# Patient Record
Sex: Male | Born: 1950
Health system: Southern US, Community
[De-identification: ages and names within clinical notes are randomized; demographics above are authoritative.]

## PROBLEM LIST (undated history)

## (undated) DIAGNOSIS — E785 Hyperlipidemia, unspecified: Secondary | ICD-10-CM

## (undated) DIAGNOSIS — K219 Gastro-esophageal reflux disease without esophagitis: Secondary | ICD-10-CM

## (undated) DIAGNOSIS — F419 Anxiety disorder, unspecified: Secondary | ICD-10-CM

## (undated) DIAGNOSIS — I10 Essential (primary) hypertension: Secondary | ICD-10-CM

## (undated) HISTORY — DX: Hyperlipidemia, unspecified: E78.5

## (undated) HISTORY — PX: PROSTATE SURGERY: SHX751

---

## 2012-11-19 ENCOUNTER — Emergency Department: Payer: Self-pay | Admitting: Unknown Physician Specialty

## 2012-11-19 LAB — BASIC METABOLIC PANEL
BUN: 14 mg/dL (ref 7–18)
Calcium, Total: 8.9 mg/dL (ref 8.5–10.1)
Chloride: 110 mmol/L — ABNORMAL HIGH (ref 98–107)
Co2: 26 mmol/L (ref 21–32)
Creatinine: 1.01 mg/dL (ref 0.60–1.30)
EGFR (Non-African Amer.): >60
Glucose: 117 mg/dL — ABNORMAL HIGH (ref 65–99)

## 2012-11-19 LAB — URINALYSIS, COMPLETE
Bilirubin,UR: NEGATIVE
Glucose,UR: NEGATIVE mg/dL (ref 0–75)
Nitrite: NEGATIVE
Ph: 5 (ref 4.5–8.0)
Protein: 100
Specific Gravity: 1.017 (ref 1.003–1.030)
Squamous Epithelial: NONE SEEN
WBC UR: 5 /HPF (ref 0–5)

## 2012-11-19 LAB — CBC
HCT: 49.4 % (ref 40.0–52.0)
HGB: 16.4 g/dL (ref 13.0–18.0)
MCH: 30.8 pg (ref 26.0–34.0)
MCHC: 33.1 g/dL (ref 32.0–36.0)
MCV: 93 fL (ref 80–100)
Platelet: 225 10*3/uL (ref 150–440)
RBC: 5.31 10*6/uL (ref 4.40–5.90)
RDW: 14.3 % (ref 11.5–14.5)
WBC: 6.8 10*3/uL (ref 3.8–10.6)

## 2012-12-12 ENCOUNTER — Ambulatory Visit: Payer: Self-pay | Admitting: Urology

## 2012-12-12 ENCOUNTER — Inpatient Hospital Stay: Payer: Self-pay | Admitting: Student

## 2012-12-12 LAB — CBC
HCT: 43.6 % (ref 40.0–52.0)
MCH: 29.6 pg (ref 26.0–34.0)
MCV: 94 fL (ref 80–100)
RBC: 4.66 10*6/uL (ref 4.40–5.90)
WBC: 8.1 10*3/uL (ref 3.8–10.6)

## 2012-12-12 LAB — URINALYSIS, COMPLETE
RBC,UR: 166688 /HPF (ref 0–5)
Specific Gravity: 1.026 (ref 1.003–1.030)
Squamous Epithelial: NONE SEEN
WBC UR: 107 /HPF (ref 0–5)

## 2012-12-12 LAB — COMPREHENSIVE METABOLIC PANEL
Bilirubin,Total: 0.7 mg/dL (ref 0.2–1.0)
Calcium, Total: 9 mg/dL (ref 8.5–10.1)
Chloride: 103 mmol/L (ref 98–107)
Creatinine: 1.34 mg/dL — ABNORMAL HIGH (ref 0.60–1.30)
EGFR (Non-African Amer.): 58 — ABNORMAL LOW
Osmolality: 272 (ref 275–301)
Potassium: 3.2 mmol/L — ABNORMAL LOW (ref 3.5–5.1)
SGOT(AST): 25 U/L (ref 15–37)
SGPT (ALT): 29 U/L (ref 12–78)
Sodium: 134 mmol/L — ABNORMAL LOW (ref 136–145)
Total Protein: 6.6 g/dL (ref 6.4–8.2)

## 2012-12-12 LAB — LIPASE, BLOOD: Lipase: 93 U/L (ref 73–393)

## 2012-12-13 LAB — CBC WITH DIFFERENTIAL/PLATELET
Basophil %: 0.4 %
Eosinophil %: 0.8 %
HCT: 32.8 % — ABNORMAL LOW (ref 40.0–52.0)
HGB: 10.9 g/dL — ABNORMAL LOW (ref 13.0–18.0)
Lymphocyte %: 15 %
MCH: 30.8 pg (ref 26.0–34.0)
MCHC: 33.2 g/dL (ref 32.0–36.0)
MCV: 93 fL (ref 80–100)
Monocyte #: 1.1 x10 3/mm — ABNORMAL HIGH (ref 0.2–1.0)
Monocyte %: 8.1 %
Neutrophil %: 75.7 %
Platelet: 169 10*3/uL (ref 150–440)
RDW: 13.6 % (ref 11.5–14.5)

## 2012-12-13 LAB — BASIC METABOLIC PANEL
Chloride: 110 mmol/L — ABNORMAL HIGH (ref 98–107)
Co2: 25 mmol/L (ref 21–32)
Creatinine: 1.24 mg/dL (ref 0.60–1.30)
EGFR (African American): >60
Osmolality: 279 (ref 275–301)
Sodium: 140 mmol/L (ref 136–145)

## 2012-12-13 LAB — URINE CULTURE

## 2012-12-14 LAB — CBC WITH DIFFERENTIAL/PLATELET
Basophil #: 0 10*3/uL (ref 0.0–0.1)
Basophil %: 0.2 %
Eosinophil #: 0.1 10*3/uL (ref 0.0–0.7)
Eosinophil %: 1.2 %
HGB: 9.3 g/dL — ABNORMAL LOW (ref 13.0–18.0)
Lymphocyte #: 1.4 10*3/uL (ref 1.0–3.6)
Lymphocyte %: 15.5 %
MCHC: 34.3 g/dL (ref 32.0–36.0)
MCV: 93 fL (ref 80–100)
Monocyte #: 0.8 x10 3/mm (ref 0.2–1.0)
Monocyte %: 8.4 %
Neutrophil #: 6.7 10*3/uL — ABNORMAL HIGH (ref 1.4–6.5)
Neutrophil %: 74.7 %
Platelet: 152 10*3/uL (ref 150–440)
RDW: 13.4 % (ref 11.5–14.5)

## 2012-12-15 LAB — BASIC METABOLIC PANEL
Creatinine: 1.18 mg/dL (ref 0.60–1.30)
EGFR (Non-African Amer.): >60
Glucose: 101 mg/dL — ABNORMAL HIGH (ref 65–99)
Osmolality: 277 (ref 275–301)
Potassium: 4.1 mmol/L (ref 3.5–5.1)
Sodium: 138 mmol/L (ref 136–145)

## 2012-12-15 LAB — CBC WITH DIFFERENTIAL/PLATELET
Basophil #: 0 10*3/uL (ref 0.0–0.1)
Basophil %: 0.1 %
Lymphocyte %: 10.2 %
MCH: 31.2 pg (ref 26.0–34.0)
MCV: 93 fL (ref 80–100)
Monocyte #: 0.9 x10 3/mm (ref 0.2–1.0)
Platelet: 168 10*3/uL (ref 150–440)
RBC: 2.64 10*6/uL — ABNORMAL LOW (ref 4.40–5.90)
WBC: 10.7 10*3/uL — ABNORMAL HIGH (ref 3.8–10.6)

## 2012-12-16 LAB — CBC WITH DIFFERENTIAL/PLATELET
Eosinophil #: 0.1 10*3/uL (ref 0.0–0.7)
Eosinophil %: 0.9 %
HCT: 24.6 % — ABNORMAL LOW (ref 40.0–52.0)
HGB: 8.4 g/dL — ABNORMAL LOW (ref 13.0–18.0)
Lymphocyte #: 1.9 10*3/uL (ref 1.0–3.6)
MCH: 31.6 pg (ref 26.0–34.0)
Monocyte %: 8.5 %
Neutrophil %: 73 %
Platelet: 214 10*3/uL (ref 150–440)
RBC: 2.67 10*6/uL — ABNORMAL LOW (ref 4.40–5.90)
WBC: 11.1 10*3/uL — ABNORMAL HIGH (ref 3.8–10.6)

## 2012-12-26 LAB — CBC
HGB: 9 g/dL — ABNORMAL LOW (ref 13.0–18.0)
MCH: 31.1 pg (ref 26.0–34.0)
MCHC: 33.8 g/dL (ref 32.0–36.0)
MCV: 92 fL (ref 80–100)
Platelet: 511 10*3/uL — ABNORMAL HIGH (ref 150–440)
RBC: 2.91 10*6/uL — ABNORMAL LOW (ref 4.40–5.90)
WBC: 11.4 10*3/uL — ABNORMAL HIGH (ref 3.8–10.6)

## 2012-12-26 LAB — URINALYSIS, COMPLETE
RBC,UR: 2355 /HPF (ref 0–5)
Specific Gravity: 1.01 (ref 1.003–1.030)
Squamous Epithelial: NONE SEEN
WBC UR: 86 /HPF (ref 0–5)

## 2012-12-26 LAB — BASIC METABOLIC PANEL
Anion Gap: 8 (ref 7–16)
Calcium, Total: 8.6 mg/dL (ref 8.5–10.1)
Chloride: 102 mmol/L (ref 98–107)
EGFR (African American): >60
Osmolality: 269 (ref 275–301)
Potassium: 3.3 mmol/L — ABNORMAL LOW (ref 3.5–5.1)
Sodium: 134 mmol/L — ABNORMAL LOW (ref 136–145)

## 2012-12-27 ENCOUNTER — Ambulatory Visit: Payer: Self-pay | Admitting: Urology

## 2012-12-27 LAB — CBC
HGB: 9.6 g/dL — ABNORMAL LOW (ref 13.0–18.0)
MCH: 31 pg (ref 26.0–34.0)
Platelet: 478 10*3/uL — ABNORMAL HIGH (ref 150–440)
RBC: 3.08 10*6/uL — ABNORMAL LOW (ref 4.40–5.90)
RDW: 14.6 % — ABNORMAL HIGH (ref 11.5–14.5)

## 2012-12-27 LAB — PROTIME-INR: INR: 1.2

## 2012-12-27 LAB — APTT: Activated PTT: 29.4 secs (ref 23.6–35.9)

## 2012-12-28 ENCOUNTER — Inpatient Hospital Stay: Payer: Self-pay | Admitting: Internal Medicine

## 2012-12-28 LAB — CBC WITH DIFFERENTIAL/PLATELET
Basophil %: 0.7 %
Eosinophil #: 0.2 10*3/uL (ref 0.0–0.7)
HCT: 22.7 % — ABNORMAL LOW (ref 40.0–52.0)
MCH: 31.1 pg (ref 26.0–34.0)
Neutrophil #: 6.9 10*3/uL — ABNORMAL HIGH (ref 1.4–6.5)
Neutrophil %: 71 %
Platelet: 383 10*3/uL (ref 150–440)
RBC: 2.45 10*6/uL — ABNORMAL LOW (ref 4.40–5.90)
WBC: 9.7 10*3/uL (ref 3.8–10.6)

## 2012-12-28 LAB — BASIC METABOLIC PANEL
BUN: 12 mg/dL (ref 7–18)
Calcium, Total: 8.5 mg/dL (ref 8.5–10.1)
Chloride: 111 mmol/L — ABNORMAL HIGH (ref 98–107)
Co2: 28 mmol/L (ref 21–32)
Creatinine: 1.15 mg/dL (ref 0.60–1.30)
EGFR (Non-African Amer.): >60
Glucose: 92 mg/dL (ref 65–99)
Osmolality: 283 (ref 275–301)
Potassium: 3.9 mmol/L (ref 3.5–5.1)

## 2012-12-28 LAB — CLOSTRIDIUM DIFFICILE BY PCR

## 2012-12-28 LAB — URINE CULTURE

## 2012-12-29 LAB — CBC WITH DIFFERENTIAL/PLATELET
Basophil #: 0 10*3/uL (ref 0.0–0.1)
Basophil %: 0.4 %
Eosinophil %: 2.5 %
Lymphocyte #: 2.1 10*3/uL (ref 1.0–3.6)
Lymphocyte %: 26.5 %
MCH: 29.9 pg (ref 26.0–34.0)
MCV: 88 fL (ref 80–100)
Monocyte %: 6.7 %
Neutrophil #: 5.1 10*3/uL (ref 1.4–6.5)
RDW: 19 % — ABNORMAL HIGH (ref 11.5–14.5)
WBC: 7.9 10*3/uL (ref 3.8–10.6)

## 2012-12-29 LAB — BASIC METABOLIC PANEL
Calcium, Total: 8.2 mg/dL — ABNORMAL LOW (ref 8.5–10.1)
Chloride: 112 mmol/L — ABNORMAL HIGH (ref 98–107)
Co2: 27 mmol/L (ref 21–32)
EGFR (African American): >60
EGFR (Non-African Amer.): >60
Osmolality: 286 (ref 275–301)
Potassium: 3.5 mmol/L (ref 3.5–5.1)
Sodium: 144 mmol/L (ref 136–145)

## 2012-12-29 LAB — HEMOGLOBIN: HGB: 9.4 g/dL — ABNORMAL LOW (ref 13.0–18.0)

## 2012-12-30 LAB — CBC WITH DIFFERENTIAL/PLATELET
Basophil #: 0.1 10*3/uL (ref 0.0–0.1)
Eosinophil #: 0.2 10*3/uL (ref 0.0–0.7)
Eosinophil %: 2.8 %
HCT: 26.5 % — ABNORMAL LOW (ref 40.0–52.0)
HGB: 9.1 g/dL — ABNORMAL LOW (ref 13.0–18.0)
Lymphocyte %: 24 %
MCHC: 34.2 g/dL (ref 32.0–36.0)
MCV: 89 fL (ref 80–100)
Monocyte #: 0.6 x10 3/mm (ref 0.2–1.0)
Monocyte %: 7.7 %
Neutrophil #: 5.3 10*3/uL (ref 1.4–6.5)
Neutrophil %: 64.8 %
RDW: 17.8 % — ABNORMAL HIGH (ref 11.5–14.5)
WBC: 8.2 10*3/uL (ref 3.8–10.6)

## 2013-02-17 ENCOUNTER — Ambulatory Visit: Payer: Self-pay | Admitting: Urology

## 2013-02-17 DIAGNOSIS — F17200 Nicotine dependence, unspecified, uncomplicated: Secondary | ICD-10-CM

## 2013-02-22 ENCOUNTER — Ambulatory Visit: Payer: Self-pay | Admitting: Urology

## 2014-09-16 NOTE — Consult Note (Signed)
Brief Consult Note: Diagnosis: Hematuria, Clot retention.   Patient was seen by consultant.   Consult note dictated.   Recommend further assessment or treatment.   Orders entered.   Discussed with Attending MD.   Comments: cont. CBI, wean to clear, Home with foley once clear offf CBI,  outpt. f/u for Cystoscopy with Dr. Lonna CobbStoioff  dict. # [id number].  Electronic Signatures: Charyl BiggerHouser, Ellwyn Ergle Ross (MD)  (Signed 19-Jul-14 11:12)  Authored: Brief Consult Note   Last Updated: 19-Jul-14 11:12 by Charyl BiggerHouser, Aayansh Codispoti Ross (MD)

## 2014-09-16 NOTE — Consult Note (Signed)
PATIENT NAME:  Richard Lamb, Eero D MR#:  [id number] DATE OF BIRTH:  Mar 12, 1951  DATE OF CONSULTATION:  12/12/2012  REFERRING PHYSICIAN:  Dorothea GlassmanPaul Malinda, MD CONSULTING PHYSICIAN:  Tawni MillersEdward R. Richardson LandryHouser, II, MD  REASON FOR CONSULTATION: Clot retention and gross hematuria.   HISTORY OF PRESENT ILLNESS: The patient is a 64 year old with no primary urologist, who presents to the ER with hematuria and blood oozing from the penis. He has had this happen once before greater than 6 months ago, which resolved on its own. He reports the last several days an increase in his baseline urinary symptoms of urgency and frequency and decreased force of stream, and now he is unable to urinate and does so with difficulty. Once in the ER, he was found to have a significant amount of blood in his bladder. His hemoglobin dropped from 13 to 6 seen on previous. A Foley catheter was inserted and was unable to be successfully irrigated due to the amount of clot retention. A CT scan was also ordered and showed a large blood clot within the bladder as well as bilateral hydronephrosis and an enlarged prostate with some pericystic inflammatory changes. The patient was subsequently admitted to medicine. He does have some acute decline in his renal function at 1.36 creatinine over a baseline of 1 and some mild metabolic derangements. The patient denies any fever, chills, sweats, nausea or vomiting.   PAST MEDICAL HISTORY: Hematuria.   PAST SURGICAL HISTORY: None.   SOCIAL HISTORY: A pack-per-day smoker, limited social alcohol use.   FAMILY HISTORY: No prostate cancer or bladder cancer.   ALLERGIES: SULFA.   HOME MEDICATIONS: None.   REVIEW OF SYSTEMS: As outlined above in the HPI.  CONSTITUTIONAL: No fever, chills or sweats.  HEAD, EYES, EARS, NOSE AND THROAT: No loss of vision or change. RESPIRATORY: No change in respiratory status. No shortness of breath.  CARDIOVASCULAR: No chest pain.  GASTROINTESTINAL: No nausea or vomiting.   GENITOURINARY: Blood in the urine, increased frequency and dysuria.  ENDOCRINE: No heat or cold intolerance.  HEMATOLOGY: Denies any bruising.  SKIN: Denies any rash or lesion.  MUSCULOSKELETAL: Does note he has some cramping.  NEUROLOGICAL: Denies ataxia or dementia.  PSYCHIATRIC: Denies depression. Is somewhat anxious.   PHYSICAL EXAMINATION:  VITAL SIGNS: The patient's current vitals are as follows: 98.3, pulse 100, respiratory rate 18, blood pressure 179/97, satting 97% on room air.  GENERAL: Well-nourished normal male in no apparent distress. Alert and oriented x3, conversational.  HEAD, EYES, EARS, NOSE AND THROAT: Normocephalic, atraumatic, nonicteric.  NECK: Supple without any lymphadenopathy.  CHEST: Clear and nonlabored.  CARDIOVASCULAR: 2+ upper and lower extremity pulses x4.  ABDOMEN: Soft. He has suprapubic tenderness and distention and palpable bladder. No rebound or guarding.  GENITOURINARY: Normal uncircumcised penis and bilaterally descended testes. He has dried blood from his meatus. His Foley catheter is clotted off.  RECTAL: Deferred by patient.  EXTREMITIES: No cyanosis, clubbing or edema.  PSYCHIATRIC: Appropriate. NEUROLOGIC: Grossly intact.  MUSCULOSKELETAL: Has 5 out of 5 strength.  SKIN: No rash or lesion.   LABORATORY VALUES: Creatinine 1.3.   IMAGING: CT scan images and report reviewed, shows blood clot within the bladder, bilateral hydronephrosis, inflammatory changes around the bladder and an enlarged prostate.   ASSESSMENT: A 64 year old with clot retention and hematuria, significant clot in the bladder, unable to be successfully irrigated. At this point in time, a 8[address] [place] catheter was placed atraumatically within the penis, and a liter of clot was removed.  At that point in time, once I got him free from clot after using a liter of irrigation, I then switched him over to a 24 [place] 3-way Foley catheter and started him on moderate rate continuous  bladder irrigation with very light pink urine output, and again, no other clot could be irrigated through. This has a 30 mL balloon with sterile water.   PLAN: 1. Continue CBI, wean to clear.  2. Monitor H and H.  3. Antibiotics as appropriate per primary team.  4. Once clear, recommend to continue Foley catheter through discharge. May downsize it if desires and should go home with Foley catheter and follow up with local urologist outpatient for cystoscopy.   ____________________________ Tawni Millers. Weldon Inches, MD erh:OSi D: 12/12/2012 11:08:07 ET T: 12/12/2012 11:39:24 ET JOB#: [id number]  cc: Tawni Millers. Weldon Inches, MD, <Dictator> Beaulah Corin MD ELECTRONICALLY SIGNED 12/13/2012 9:42

## 2014-09-16 NOTE — Consult Note (Signed)
Urology Consultation Report for Consultation: Recurrent Gross Hematuria MD: Enid Baasadhika Kalisetti, M.D.MD: Marin OlpJay H. Helaina Stefano, M.D.Local Urologist: Irineo AxonScott Stoioff, M.D. 64 y.o. AAM [facility] (7/19-22/2014). At that hospitalization, the pt was taken to the OR for diagnostic cystourethroscopy by Dr. Lonna CobbStoioff on 12/14/2012. Trilobar prostatic enlargement with bladder neck hypervascularity was appreciated with fulguration of obvious bleeders performed. The pt was d/c'd on finasteride, tamsulosin and ferrous sulfate. The pt underwent a voiding trial 12/18/2012 with the foley removed in Dr. Heywood FootmanStoioff's office. The pt did well initially, voiding well with only mild dysuria, but no freq/urgency or F/C.  The pt developed recurrent gross hematuria 2 days ago that progressed to clot retention prompting [facility]. A foley catheter was placed and the bladder irrigated. The pt is admitted for CBI. as per the detailed H&P by Dr. Burnice Loganarwich dated 12/27/2012 with the following additions: PT endorses a FH of Urolithiasis T (98.105F), P (64), RR (16), BP (105/64), RA Sat (98%)WDWN AAM in MADNC/AT, EOMI, Anictericno masses or bruitsCTA, nL Respiratory EffortRRR without M/G/R, 2+ Radial Pulses b/LNT/ND, no palpable masses/organomegalynL uncirc phallus with foley catheter in place draining lt pink to clear urine on moderate CBI; b/L descended testes/epididymides - NT, no massesno edemanon-focalA&O x4, appropriateno lesions about the Head/Neck 12/27/2012: WBC (15k), Hct (28.7%), Plt (478k)         12/26/2012: BUN (13), Cr (0.88) No current indication for acute urologic intervention. Recurrent Gross Hematuria - likely due to prostate mucosal/bladder neck varices, however, the upper tracts have not been evaluated.Prostate Hyperplasia - PSA was elevated at 6.3 on 12/12/2012 (?due to prostate manipulation with foley placement/bladder irrigation vs possible malignancy)  Continue NS CBI to keep the urine  light pink to clear.Continue finasteride and tamsulosin.Check PT/PTT to r/o occult coagulopathy.CT Urogram (3 Phase CT of the Abd/Pelvis without and with IV Contrast) to r/o upper tract lesion Stoioff will assume care at 5am, December 28, 2012.  Electronic Signatures: Marin OlpKim, Jaqua Ching H (MD)  (Signed on 03-Aug-14 12:33)  Authored  Last Updated: 03-Aug-14 12:33 by Marin OlpKim, Kwali Wrinkle H (MD)

## 2014-09-16 NOTE — H&P (Signed)
PATIENT NAME:  Richard Lamb, Richard Lamb MR#:  [id number] DATE OF BIRTH:  Dec 30, 1950  DATE OF ADMISSION:  02/22/2013  CHIEF COMPLAINT: Difficulty voiding and hematuria.   HISTORY OF PRESENT ILLNESS: Richard Lamb is a 64 year old African American male who presented with clot retention and significant prostatic bleeding requiring blood transfusion in early August. CT scan indicated normal kidneys but large prostate. Further investigation in the office indicated trilobar BPH and bladder outlet obstruction. He also had a bladder stone present. The patient comes in now for photovaporization of the prostate with green light laser and litholapaxy of the bladder stone with the holmium laser.   ALLERGIES: SULFA DRUGS.   CURRENT MEDICATIONS: Tamsulosin, finasteride and iron sulfate.   PAST SURGICAL HISTORY: Ankle surgery.   SOCIAL HISTORY: The patient denied alcohol use. He smokes a pack a day and has a 30 pack-year history.   FAMILY HISTORY: Remarkable for BPH and hypertension.   PAST AND CURRENT MEDICAL CONDITIONS: Negative.   REVIEW OF SYSTEMS: The patient denied heart disease, lung disease, diabetes, stroke, hypertension, shortness of breath or chest pain.   PHYSICAL EXAMINATION: GENERAL: A well-nourished African American male in no distress.  HEENT: Sclerae were clear. Pupils were equally round and reactive to light and accommodation.  NECK: Supple. No palpable cervical adenopathy.  LUNGS: Clear to auscultation.  HEART: Regular rhythm and rate without audible murmurs.  ABDOMEN: Soft, nontender abdomen.  GENITOURINARY: Uncircumcised. Testes smooth and nontender, 20 mL in size each.  RECTAL: Internal hemorrhoids. Prostate gland 40 grams, smooth and nontender.  NEUROMUSCULAR: Alert and oriented x 3.   IMPRESSION:  1.  Benign prostatic hypertrophy with bladder outlet obstruction.  2.  Gross hematuria due to benign prostatic hypertrophy and bladder stone.  3.  Bladder stone.   PLAN: Photovaporization  of the prostate with green light laser and litholapaxy of the bladder stone with the holmium laser.  ____________________________ Suszanne ConnersMichael R. Evelene CroonWolff, MD mrw:sb Lamb: 02/18/2013 08:28:58 ET T: 02/18/2013 08:41:18 ET JOB#: [id number]  cc: Suszanne ConnersMichael R. Evelene CroonWolff, MD, <Dictator> Orson ApeMICHAEL R Garnell Phenix MD ELECTRONICALLY SIGNED 02/19/2013 8:41

## 2014-09-16 NOTE — H&P (Signed)
PATIENT NAME:  Richard Lamb, KESTLER MR#:  [id number] DATE OF BIRTH:  1950/10/13  DATE OF ADMISSION:  12/27/2012  DATE OF BIRTH: February 18, 1951   PRIMARY CARE PHYSICIAN: He has no doctor.   REFERRING PHYSICIAN: Dr. Chiquita Loth.   CHIEF COMPLAINT: Suprapubic discomfort associated with gross hematuria and passing blood clots.   HISTORY OF PRESENT ILLNESS: Richard Lamb is a 64 year old African-American male admitted to this hospital on 12/12/2012 and discharged on 12/15/2012, when he presented with gross hematuria associated with passing blood clots, obstructive uropathy resulting in hydronephrosis and acute renal failure. He had bladder irrigation and then consultation with Urology, when the patient was evaluated by Dr. Lonna Cobb and he had cystoscopy with evacuation of blood clots. With improvement in his symptoms, he was discharged home to have followup the second day, an appointment with Dr. Lonna Cobb. However, the patient tells me that when he went home nobody gave him a follow up appointment and he appears to have no clue about this issue. He remained asymptomatic until yesterday, when symptoms recurred again, started passing blood with clots and developed suprapubic discomfort and pain and difficulty to urinate. He came back today with the same symptoms and bladder irrigation started here with CBI. Following this, the patient started to feel better and the discomfort had improved. His hemoglobin at time of admission is 9. His hemoglobin at time of discharge on July 22nd was 8.   REVIEW OF SYSTEMS: CONSTITUTIONAL: Denies having any fever. No chills. No fatigue.  EYES: No blurring of vision or double vision.  ENT: No hearing impairment. No sore throat. No dysphagia.  CARDIOVASCULAR: No chest pain. No shortness of breath. No syncope.  RESPIRATORY: No cough. No shortness of breath. No chest pain.  GASTROINTESTINAL: No abdominal pain, other than the suprapubic discomfort. No vomiting, no diarrhea.  GENITOURINARY:  He is having gross hematuria and passing blood clots with some difficulty to urinate.  MUSCULOSKELETAL: No joint pain or swelling. No muscular pain or swelling.  INTEGUMENTARY: No skin rash. No ulcers.  NEUROLOGY: No focal weakness. No seizure activity. No headache.  PSYCHIATRY: No anxiety. No depression.  ENDOCRINE: No polyuria or polydipsia. No heat or cold intolerance.   PAST MEDICAL HISTORY: Recent admission on July 19th this month with gross hematuria, underwent cystoscopy with evacuation of blood clots. He was found to have enlarged prostate with some prostatic mass. His recent PSA on July 19th was 6.3. Also, during this admission earlier this month, he had acute renal failure associated with mild bilateral hydronephrosis secondary to obstructive uropathy from clot formation; however, the renal failure had resolved. His history also includes tobacco abuse.   PAST SURGICAL HISTORY: None.   FAMILY HISTORY: He reports that he has one uncle who has prostatic hypertrophy. His father suffers from hypertension. His mother is healthy.   SOCIAL HABITS: Chronic smoker, 1/2 pack a day and continues to smoke.  He started smoking at age of 31. He drinks alcohol only occasionally. No other drug abuse.    SOCIAL HISTORY: The patient works at a [employer].  ADMISSION MEDICATIONS:  Tamsulosin 0.4 mg once a day at the right 5 mg a day, finasteride 5 mg daily, ferrous sulfate 325 mg twice a day.   ALLERGIES:  SULFA CAUSING SOME GI SIDE EFFECTS.    PHYSICAL EXAMINATION: VITAL SIGNS: Blood pressure 112/55, respiratory rate 20, pulse 88, temperature 96.2, oxygen saturation 99%.  GENERAL APPEARANCE: This is a middle-aged male lying in bed, in no acute distress.  HEAD AND  NECK: No pallor. No icterus. No cyanosis. Ear examination revealed normal hearing, no discharge, no lesions. Examination of the nose showed no discharge, no bleeding, no ulcers. Oropharyngeal examination revealed normal lips and tongue. No  oral thrush, no exudate. Eye examination revealed normal eyelids and conjunctivae. Both pupils were small and constricted. I could not see reactivity to light. Neck is supple. Trachea at midline. No thyromegaly. No cervical lymphadenopathy. No masses.  HEART: Normal S1, S2. No S3, S4. No murmur. No gallop. No carotid bruits. LUNGS: Normal breathing pattern without use of accessory muscles. No rales.  No wheezing.  ABDOMEN: Soft without tenderness. No hepatosplenomegaly. No masses. No hernias.  SKIN: No ulcers. No subcutaneous nodules.  MUSCULOSKELETAL: No joint swelling. No clubbing.  NEUROLOGIC: Cranial nerves II through XII are intact. No focal motor deficit.  PSYCHIATRIC: The patient is alert and oriented x3. Mood and affect were normal.   LABORATORY FINDINGS: His CBC showed white count of 11,000. The white blood cell count at discharge was also 11. His hemoglobin is 9. His hemoglobin at discharge on July 23rd  was 8.4. His hematocrit now is 26 with a platelet count of 511. His recent PSA on July 19th was 6.3. Serum glucose 109, BUN 13, creatinine 0.8, sodium 134, potassium 3.3. Calcium 8.6. Urinalysis showed red-colored urine, 2355 red blood cells, 86 white blood cells.   ASSESSMENT: 1.  Gross hematuria.  2. Prostatic enlargement, thought to be benign prostatic hypertrophy; however, there is also prostatic mass and it is under evaluation.  3.  Mild hyponatremia.  4.  Mild hypokalemia.  5.  Tobacco abuse.   PLAN: We will admit the patient to the hospital for observation. Continue bladder irrigation with CBI. Urologic consultation. Follow up on the hemoglobin. Potassium supplementation. For deep vein thrombosis prophylaxis, will use TED stockings and compression. We will avoid chemical anticoagulation given his gross hematuria. Time spent in evaluating this patient and reviewing medical records took more than 55 minutes. I advised the patient to quit  smoking.   ____________________________ Carney CornersAmir M. Rudene Rearwish, MD amd:nts D: 12/27/2012 05:02:00 ET T: 12/27/2012 05:25:17 ET JOB#: [id number]  cc: Carney CornersAmir M. Rudene Rearwish, MD, <Dictator> Zollie ScaleAMIR M Jyasia Markoff MD ELECTRONICALLY SIGNED 12/29/2012 6:59

## 2014-09-16 NOTE — Consult Note (Signed)
Chief Complaint:  Subjective/Chief Complaint no complaints this am   VITAL SIGNS/ANCILLARY NOTES: **Vital Signs.:   22-Jul-14 04:06  Vital Signs Type Routine  Temperature Temperature (F) 98.1  Celsius 36.7  Temperature Source oral  Pulse Pulse 76  Respirations Respirations 18  Systolic BP Systolic BP 620  Diastolic BP (mmHg) Diastolic BP (mmHg) 56  Mean BP 72  Pulse Ox % Pulse Ox % 93  Pulse Ox Activity Level  At rest  Oxygen Delivery Room Air/ 21 %   Brief Assessment:  Additional Physical Exam CBI effluent clear on low flow   Lab Results: Routine Chem:  22-Jul-14 04:03   Glucose, Serum  101  BUN 17  Creatinine (comp) 1.18  Sodium, Serum 138  Potassium, Serum 4.1  Chloride, Serum 105  CO2, Serum 27  Calcium (Total), Serum  8.4  Anion Gap  6  Osmolality (calc) 277  eGFR (African American) >60  eGFR (Non-African American) >60 (eGFR values <49m/min/1.73 m2 may be an indication of chronic kidney disease (CKD). Calculated eGFR is useful in patients with stable renal function. The eGFR calculation will not be reliable in acutely ill patients when serum creatinine is changing rapidly. It is not useful in  patients on dialysis. The eGFR calculation may not be applicable to patients at the low and high extremes of body sizes, pregnant women, and vegetarians.)  Routine Hem:  22-Jul-14 04:03   WBC (CBC)  10.7  RBC (CBC)  2.64  Hemoglobin (CBC)  8.2  Hematocrit (CBC)  24.5  Platelet Count (CBC) 168  MCV 93  MCH 31.2  MCHC 33.7  RDW 13.0  Neutrophil % 80.9  Lymphocyte % 10.2  Monocyte % 8.8  Eosinophil % 0.0  Basophil % 0.1  Neutrophil #  8.6  Lymphocyte # 1.1  Monocyte # 0.9  Eosinophil # 0.0  Basophil # 0.0 (Result(s) reported on 15 Dec 2012 at 05:44AM.)   Assessment/Plan:  Assessment/Plan:  Assessment Hematuria secondary to prostatic bleeding.  The hematuria appears to have resolved with Amicar.   Plan 1. Discontinue CBI  2. Start tamsulosin and  finasteride 3.  Can discharge with an indwelling Foley if no recurrent bleeding CBI.  Follow-up 7/24 for catheter removal   Electronic Signatures: SAbbie Sons(MD)  (Signed 22-Jul-14 08:05)  Authored: Chief Complaint, VITAL SIGNS/ANCILLARY NOTES, Brief Assessment, Lab Results, Assessment/Plan   Last Updated: 22-Jul-14 08:05 by SAbbie Sons(MD)

## 2014-09-16 NOTE — Consult Note (Signed)
Chief Complaint:  Subjective/Chief Complaint Required intermittent manual irrigation overnight   VITAL SIGNS/ANCILLARY NOTES: **Vital Signs.:   21-Jul-14 03:58  Vital Signs Type Routine  Temperature Temperature (F) 99.1  Celsius 37.2  Temperature Source oral  Pulse Pulse 100  Respirations Respirations 18  Systolic BP Systolic BP 94  Diastolic BP (mmHg) Diastolic BP (mmHg) 59  Mean BP 70  Pulse Ox % Pulse Ox % 90  Pulse Ox Activity Level  At rest  Oxygen Delivery Room Air/ 21 %   Brief Assessment:  Additional Physical Exam Effluent pink tinged on high flow. Red on low flow.   Assessment/Plan:  Assessment/Plan:  Assessment Hematuria: Etiology is undetermined.  He continues to have hematuria on CBI X 48 hrs which does require intermittent manual irrigation.   Plan Cystoscopy under anesthesia with clot evacuation, possible TURBT and possible bilateral retrograde pyelogram.  The indications and nature of the planned procedure were discussed in detail with the patient including the potential risks of continued bleeding, infection and bladder injury.  He indicated all questions were answered to his satisfaction and desires to proceed.   Electronic Signatures: Riki AltesStoioff, Scott C (MD)  (Signed 21-Jul-14 08:12)  Authored: Chief Complaint, VITAL SIGNS/ANCILLARY NOTES, Brief Assessment, Assessment/Plan   Last Updated: 21-Jul-14 08:12 by Riki AltesStoioff, Scott C (MD)

## 2014-09-16 NOTE — Consult Note (Signed)
Chief Complaint:  Subjective/Chief Complaint Difficulty sleeping   VITAL SIGNS/ANCILLARY NOTES: **Vital Signs.:   05-Aug-14 04:34  Vital Signs Type Routine  Pulse Pulse 72  Respirations Respirations 20  Systolic BP Systolic BP 104  Diastolic BP (mmHg) Diastolic BP (mmHg) 67  Mean BP 79  BP Source  if not from Vital Sign Device non-invasive  Pulse Ox % Pulse Ox % 93  Pulse Ox Activity Level  At rest  Oxygen Delivery Room Air/ 21 %  Pulse Ox Heart Rate 78   Brief Assessment:  Additional Physical Exam CBI clear on high flow- was placed to low flow with pink tinged effluent   Radiology Results: CT:    03-Aug-14 14:29, CT Abdomen and Pelvis W/WO Contrast  CT Abdomen and Pelvis W/WO Contrast   REASON FOR EXAM:    (1) Gross hematuria - r/o renal or ureteral lesion;   (2) Gross Hematuria - r/o ur  COMMENTS:   May transport without cardiac monitor    PROCEDURE: CT  - CT ABDOMEN / PELVIS  W/WO  - Dec 27 2012  2:29PM     RESULT:     Comparison is made to a prior study dated 12/12/2012. The delay in   dictation was secondary to technical issues and image retrieval.    Technique: Helical 3 mm sections were obtained from the lung bases   through the pubic symphysis status post intravenous administration of 100   ml of Isovue-370. Delayed imaging was obtained.  Findings:  The lung bases are grossly unremarkable. The liver, spleen,   adrenals, and pancreas is unremarkable.    Evaluation of the kidneys demonstrates no evidence of hydronephrosis,   masses, or evidence of calculi. The left ureter is mildly dilated. There   is minimal dilation of the right ureter. There is no evidence of an   abdominal aortic aneurysm. The celiac, SMA, IMA, and portal vein are   opacified.    There is no evidence of bowel obstruction, enteritis, colitis,   diverticulitis, or appendicitis. Evaluation of the urinary bladder   demonstrates an amorphous appearing area of intermediate to high   attenuation  within the bladder. Along the peripheral base of the bladder,   rounded, mass-like areas are appreciated with punctate calcifications.   Air is identified within the nondependent portion of the bladder. There     is inflammatory change and a small amount of fluid within the mesenteric   fat adjacent to the urinary bladder as well as mild bladder wall   thickening. No drainable loculated fluid collections are identified or   evidence of free air.     There is no evidence of an abdominal aortic aneurysm. Coarse vascular   calcifications are identified within the aorta and mesenteric vessels.    IMPRESSION:      1. Considering the patient's history, there are findings within the   urinary bladder consistent with hematoma. A bladder wall mass along the   posterior periphery of the bladder is also of diagnostic consideration.   Further evaluation with direct visualization is recommended. There is   inflammatory change surrounding the bladder which may represent     desmoplastic reaction or possibly the sequela of a component of cystitis.   2. No further focal or acute abnormality identified.   3. Vascular calcifications are appreciated.       Thank you for this opportunity to contribute to the care of your patient.         Verified  By: Jani FilesHECTOR W. COOPER, M.D., MD   Assessment/Plan:  Assessment/Plan:  Assessment Hematuria- improved. CT findongs c/w clot as recent cysto showed no tumor   Plan CBI presently looks good.  Would taper CBI this am and if no increased bleeding d/c CBI.  May be able to d/c foley tomorrow   Electronic Signatures: Riki AltesStoioff, Nilan Iddings C (MD)  (Signed 05-Aug-14 07:10)  Authored: Chief Complaint, VITAL SIGNS/ANCILLARY NOTES, Brief Assessment, Radiology Results, Assessment/Plan   Last Updated: 05-Aug-14 07:10 by Riki AltesStoioff, Debrah Granderson C (MD)

## 2014-09-16 NOTE — Discharge Summary (Signed)
PATIENT NAME:  Richard Lamb, Hobson D MR#:  [id number] DATE OF BIRTH:  November 01, 1950  DATE OF ADMISSION:  12/27/2012 DATE OF DISCHARGE:  12/30/2012  ADMITTING PHYSICIAN: Dr. Rudene Rearwish  DISCHARGING PHYSICIAN: Dr. Enid Baasadhika Travez Stancil  PRIMARY CARE PHYSICIAN: None  CONSULTATIONS IN THE HOSPITAL: Dr. Lonna CobbStoioff   DISCHARGE DIAGNOSES: 1.  Gross hematuria.  2.  Benign prostatic hypertrophy.  3.  Prostrate varices causing the hematuria.  4.  Acute on chronic blood loss anemia this admission requiring 2 units of packed red blood cell transfusion.   DISCHARGE MEDICATIONS: 1.  Finasteride 5 mg p.o. daily.  2.  Flomax 0.4 mg p.o. daily.  3.  Ferrous sulfate 325 mg p.o. b.i.d.   DISCHARGE DIET: Regular.   DISCHARGE ACTIVITY: As tolerated.   FOLLOW-UP INSTRUCTIONS: 1.  Follow up with Dr. Lonna CobbStoioff in 2 weeks.  2.  Advised to drink plenty of fluids, preferably water.   LABS AND IMAGING STUDIES PRIOR TO DISCHARGE:  WBC is 8.2, hemoglobin 9.1, hematocrit 26.5, platelet count is 287.  Sodium 144, potassium 3.5, chloride 112, bicarbonate of 27, BUN 11, creatinine 1.21, glucose 90 and calcium of 8.2. Stool for C. diff was negative. Urine culture showing only 3000 gram-positive cocci.  INR was 1.2, PTT 29.4.  CT of the abdomen and pelvis with and without contrast showing findings in the gallbladder consistent with hematoma. Gallbladder mass cannot be ruled out. No focal or acute abnormality otherwise noted. Vascular calcifications are seen.   BRIEF HOSPITAL COURSE:  Richard Lamb is a 64 year old African American male with no significant past medical history other than a recent admission on 12/12/2012 for hematuria, was seen by Dr. Lonna CobbStoioff during that admission, had a cystoscopy done which showed enlarged prostate with friable hypervascular prostatic varices which were probably the cause of his hematuria at the time.  He was treated with Amicar, which is aminocaproic acid to control the bleeding during this admission and  was discharged with a Foley catheter on finasteride and Flomax. The patient was seen by Dr. Lonna CobbStoioff as an outpatient a couple of days after discharge, had Foley catheter taken out.  His bleeding had resolved. He was doing fine up until 12/26/2012 when he started to have hematuria again, so he presented to the hospital on 08/3 and was admitted for the same problem.   Gross hematuria secondary to hypervascular prostate from prostatic varices which were recently seen on cystoscopy done by Dr. Lonna CobbStoioff in the hospital on 12/14/2012:  The patient was admitted for continuous bladder irrigation.  Several clots were cleaned out, and he had a Foley catheter in the hospital. During this hospitalization, his hemoglobin has dropped significantly to 7.6, whereas his baseline is around 13.8, and he did receive 2 units of packed RBC transfusion for symptomatic anemia. His urine has pretty much cleared up now. He again required Amicar during this hospitalization which is being stopped at the time of discharge. The patient is feeling fine, his urine has cleared up.  He still might get some dark clots from his bladder. CT of the abdomen was done which did not show any upper urological causes of gross hematuria like in the kidneys or ureteral masses. He is being discharged on finasteride and Flomax again.  The goal is to shrink the size of the prostate. Hopefully he will not have further bleeding again, but if he does have recurrent bleeding he will need transurethral resection of prostate. The patient is advised to follow up with Dr. Lonna CobbStoioff as an outpatient in the  next couple of weeks. His course has been otherwise uneventful in the hospital.   DISCHARGE CONDITION: Stable.   DISCHARGE DISPOSITION: Home.        TIME SPENT ON DISCHARGE: 45 minutes.   ____________________________ Enid Baas, MD rk:cb D: 12/30/2012 13:44:27 ET T: 12/30/2012 20:43:21 ET JOB#: [id number]  cc: Enid Baas, MD,  <Dictator> Scott C. Lonna Cobb, MD Enid Baas MD ELECTRONICALLY SIGNED 01/08/2013 14:12

## 2014-09-16 NOTE — Consult Note (Signed)
CBI was discontinued yesterday afternoon with no recurrent bleeding.  Urine today is darker consistent with old blood.  Recommendation: 1.  Okay to DC Foley catheter 2.  Okay for discharge if no voiding problems post catheter removal 3.  Follow-up with me in approximately 2 weeks 4.  Continue finasteride and tamsulosin  Electronic Signatures: Riki AltesStoioff, Scott C (MD)  (Signed on 06-Aug-14 08:23)  Authored  Last Updated: 06-Aug-14 08:23 by Riki AltesStoioff, Scott C (MD)

## 2014-09-16 NOTE — Consult Note (Signed)
Cystoscopy today remarkable for a hypervascular, friable prostate. He has no complaints.  CBI effluent is medium red on low-flow. Plan: Will start Amicar.  Electronic Signatures: Riki AltesStoioff, Scott C (MD)  (Signed on 21-Jul-14 19:05)  Authored  Last Updated: 21-Jul-14 19:05 by Riki AltesStoioff, Scott C (MD)

## 2014-09-16 NOTE — Consult Note (Signed)
Chief Complaint:  Subjective/Chief Complaint no events, no complaints   VITAL SIGNS/ANCILLARY NOTES: **Vital Signs.:   20-Jul-14 04:28  Vital Signs Type Routine  Temperature Temperature (F) 98.7  Celsius 37  Temperature Source oral  Pulse Pulse 90  Respirations Respirations 18  Systolic BP Systolic BP 93  Diastolic BP (mmHg) Diastolic BP (mmHg) 68  Mean BP 76  Pulse Ox % Pulse Ox % 93  Pulse Ox Activity Level  At rest  Oxygen Delivery Room Air/ 21 %  *Intake and Output.:   Daily 20-Jul-14 07:00  Grand Totals Intake:  55464 Output:  [id number]    Net:  -9006 24 Hr.:  -9006  IV (Primary)      In:  1464  Urine ml     Out:  1400  Continuous Bladder Irrigation ml     In:  54000    Out:  [id number]    Net:  -9070  Length of Stay Totals Intake:  55464 Output:  [id number]    Net:  -9006   Brief Assessment:  GEN no acute distress   Cardiac Regular   Respiratory normal resp effort   Gastrointestinal details normal Soft  Nontender  Nondistended   Additional Physical Exam CBI with low rate, mostly clear, rare stringy clot   Lab Results: Routine Chem:  19-Jul-14 01:56   Creatinine (comp)  1.34  20-Jul-14 04:17   Creatinine (comp) 1.24  Routine Hem:  19-Jul-14 01:56   Hemoglobin (CBC) 13.8  Hematocrit (CBC) 43.6    17:15   Hemoglobin (CBC)  12.8 (Result(s) reported on 12 Dec 2012 at 05:26PM.)  20-Jul-14 04:17   Hemoglobin (CBC)  10.9  Hematocrit (CBC)  32.8   Assessment/Plan:  Assessment/Plan:  Assessment 64 yo with clot retention, hematuria improving on CBI,  Cr. improved.  H/H 10/32.  Had large prostate and  hydro on intial CT   Plan - cont. plan.   - wean CBI today - Home with foley (due to hydro) once clear off CBI - outpt. cysto with Dr. Lonna CobbStoioff   Electronic Signatures: Charyl BiggerHouser, Tameko Halder Ross (MD)  (Signed 20-Jul-14 09:53)  Authored: Chief Complaint, VITAL SIGNS/ANCILLARY NOTES, Brief Assessment, Lab Results, Assessment/Plan   Last Updated: 20-Jul-14 09:53 by  Charyl BiggerHouser, Arcangel Minion Ross (MD)

## 2014-09-16 NOTE — Op Note (Signed)
PATIENT NAME:  Richard Lamb, Richard Lamb MR#:  [id number] DATE OF BIRTH:  1950-09-13  DATE OF PROCEDURE:  02/22/2013  PREOPERATIVE DIAGNOSES 1.  Benign prostatic hypertrophy with bladder outlet obstruction.  2.  Bladder stones.   POSTOPERATIVE DIAGNOSES 1.  Benign prostatic hypertrophy with bladder outlet obstruction.  2.  Bladder stones.   PROCEDURES 1.  Photovaporization of the prostate with green light laser and TURP.  2.  Litholapaxy of bladder stone with the holmium laser.   SURGEON: Anola GurneyMichael Wolff, MD   ANESTHETISPernell Dupre: Adams.     ANESTHESIA: General.   INDICATIONS: See the dictated history and physical. After informed consent, the patient requests the above procedure.   OPERATIVE SUMMARY: After adequate general anesthesia had been obtained, the patient was placed into dorsal lithotomy position and the perineum was prepped and draped in the usual fashion. The laser scope was coupled with the camera and then visually advanced into the bladder. The patient had trilobar BPH with a large intravesical median lobe. The patient also had a greater than 3 cm bladder stone present. No bladder tumors were identified. At this point, the  1000 micron holmium laser fiber was introduced through the scope and the bladder stone was fragmented. The bladder stone appeared to have a soft core which was not amenable to the holmium laser. Therefore, the green light laser was passed through the scope and the core of the stone was vaporized partially with the green light laser. Again, this was not completely effective. At this point, the green light laser was used to vaporize a portion of the bladder neck at a setting of 80 watts. Due to the large size of the median lobe and the presence of residual stone fragment, the laser scope was removed and exchanged for a resectoscope. Using the loop, the remaining soft tissue component of the stone was fragmented and the components were brought out with the loop. The prostate was then  resected beginning at the median lobe followed by the lateral lobes. Prostate chips were evacuated from the bladder. All bleeders were attended to with electrocautery. At this point, the scope was removed and a 20-French silicone catheter was placed. The catheter was irrigated until clear. A B and O suppository was placed. The procedure was then terminated and the patient was transferred to the recovery room in stable condition.   ____________________________ Suszanne ConnersMichael R. Evelene CroonWolff, MD mrw:cs Lamb: 02/22/2013 13:18:39 ET T: 02/22/2013 14:23:20 ET JOB#: [id number]  cc: Suszanne ConnersMichael R. Evelene CroonWolff, MD, <Dictator> Orson ApeMICHAEL R WOLFF MD ELECTRONICALLY SIGNED 02/23/2013 10:58

## 2014-09-16 NOTE — Discharge Summary (Signed)
PATIENT NAME:  Richard Lamb, Richard Lamb MR#:  [id number] DATE OF BIRTH:  [birth year]  DATE OF ADMISSION:  12/12/2012 DATE OF DISCHARGE:  12/15/2012  CONSULTANTS: Dr. Alben Deeds and Dr. Lonna Cobb from urology.   PRIMARY CARE PHYSICIAN: None.   CHIEF COMPLAINT: Hematuria.   DISCHARGE DIAGNOSES: 1.  Acute posthemorrhagic anemia, likely secondary to hematuria and urinary clot.  2.  Possible prostate mass.  3.  Acute renal failure, likely secondary to clot forming bilateral mild hydronephrosis.  4.  Tobacco abuse.  5.  Hypokalemia.   DISCHARGE MEDICATIONS: Finasteride 5 mg once a day, tamsulosin 0.4 mg once a day after a meal, ferrous sulfate 325 mg 2 times a day. He will be being discharged with a Foley to leg bag.   DIET: Regular.   ACTIVITY: As tolerated.   FOLLOWUP: Please follow with Dr. Lonna Cobb tomorrow as discussed by him. Please check complete blood count within a week. If recurrent blood in the Foley back, call your urologist right away.   DISPOSITION: Home.   SIGNIFICANT LABS AND IMAGING: Initial BUN 17, creatinine of 1.34. Last creatinine of 1.18, initial sodium 134, potassium 3.2. LFTs on arrival within normal limits. Initial WBC of 8.1, hemoglobin 13.8, platelets of 222, lowest hemoglobin was 8.2, yesterday and today is trending back up to 8.4. Urine cultures no growth to date. PSA slightly elevated at 6.3 and CT abdomen and pelvis without contrast shows a large mass-like area of 7.5 x 6.2 cm with coarse calcifications concerning for lateral mass and/or associated hematoma resulting in mild bilateral hydronephrosis. Pericystic inflammatory changes concerning for possible cystitis.    HISTORY OF PRESENT ILLNESS AND HOSPITAL COURSE: For full details of H and P, please see the dictation on July 19th by Dr. Randol Kern , but briefly this is a pleasant 64 year old African American male who came in to the hospital a few weeks ago and was diagnosed with urinary tract infection, discharged home with p.o.  antibiotics. The patient symptoms improved somewhat, but came back and the patient had some hematuria. He came into the hospital and was noted to have a drop in hemoglobin to 13 from 16, per H and P. A CAT scan showed possible large blood clots within the bladder versus a mass and some mild bilateral hydronephrosis. The patient was admitted to the hospitalist service and Urology was consulted. The patient was seen by Dr. Richardson Landry over the weekends and underwent continuous bladder irrigation and clots were extracted. He was maintained CBI.  The patient was then seen by Dr. Lonna Cobb who took the patient for cystoscopy. The patient did have off-and-on clots and blood in CBI bags. The cystoscopy showed hypovascular and friable prostate and clot was evacuated. The patient was given Amicar told q. 6 hours for 6 doses. He was on Levaquin while here, but the urine cultures are no growth to date. He had no fever. By discharge the CBI has been stopped and the patient will be seen by Dr. Lonna Cobb as an outpatient tomorrow and has been started on finasteride as well as Tamsulosin. He did drop his hemoglobin but it is above the threshold for transfusion and currently he is not symptomatic. The hemoglobin is trending back up and he will be discharged on iron. He should follow up with Urology tomorrow per Dr. Lonna Cobb for further prostate care. The renal failure has resolved. The hypokalemia has resolved. On the day of discharge, the patient had a temperature of 99, pulse rate 93, respiratory rate of 18, blood pressure 107/71, O2  sat of 95%. He not acutely distressed. He has regular rate and rhythm and auscultation of the heart sounds. He does not have any crackles and has no significant abdominal tenderness. His Foley has slight tea-colored urine in the back, but there are no clots. He will be discharged with outpatient follow-up.   TOTAL TIME SPENT: 35 minutes.   ____________________________ Krystal EatonShayiq Nickoli Bagheri,  MD sa:dp D: 12/16/2012 13:40:01 ET T: 12/16/2012 15:50:35 ET JOB#: [id number]  cc: Krystal EatonShayiq Latreshia Beauchaine, MD, <Dictator> Krystal EatonSHAYIQ Krystalynn Ridgeway MD ELECTRONICALLY SIGNED 12/27/2012 11:40

## 2014-09-16 NOTE — Consult Note (Signed)
Chief Complaint:  Subjective/Chief Complaint Wants to go home   VITAL SIGNS/ANCILLARY NOTES: **Vital Signs.:   04-Aug-14 07:17  Vital Signs Type Routine  Temperature Temperature (F) 98.1  Celsius 36.7  Temperature Source oral  Pulse Pulse 100  Respirations Respirations 25  Systolic BP Systolic BP 355  Diastolic BP (mmHg) Diastolic BP (mmHg) 68  Mean BP 93  BP Source  if not from Vital Sign Device non-invasive  Pulse Ox % Pulse Ox % 96  Pulse Ox Activity Level  At rest  Oxygen Delivery Room Air/ 21 %  Pulse Ox Heart Rate 100   Brief Assessment:  Additional Physical Exam CBI effluent pink on moderate flow   Lab Results: Routine Chem:  04-Aug-14 05:26   Glucose, Serum 92  BUN 12  Creatinine (comp) 1.15  Sodium, Serum 142  Potassium, Serum 3.9  Chloride, Serum  111  CO2, Serum 28  Calcium (Total), Serum 8.5  Anion Gap  3  Osmolality (calc) 283  eGFR (African American) >60  eGFR (Non-African American) >60 (eGFR values <82m/min/1.73 m2 may be an indication of chronic kidney disease (CKD). Calculated eGFR is useful in patients with stable renal function. The eGFR calculation will not be reliable in acutely ill patients when serum creatinine is changing rapidly. It is not useful in  patients on dialysis. The eGFR calculation may not be applicable to patients at the low and high extremes of body sizes, pregnant women, and vegetarians.)  Routine Hem:  04-Aug-14 05:26   WBC (CBC) 9.7  RBC (CBC)  2.45  Hemoglobin (CBC)  7.6  Hematocrit (CBC)  22.7  Platelet Count (CBC) 383  MCV 93  MCH 31.1  MCHC 33.5  RDW  15.2  Neutrophil % 71.0  Lymphocyte % 18.5  Monocyte % 8.1  Eosinophil % 1.7  Basophil % 0.7  Neutrophil #  6.9  Lymphocyte # 1.8  Monocyte # 0.8  Eosinophil # 0.2  Basophil # 0.1 (Result(s) reported on 28 Dec 2012 at 05:50AM.)   Assessment/Plan:  Assessment/Plan:  Assessment Hematuria- recurrent.   Plan Cystoscopy at last hospitalization was  remarkable for a hypervascular, friable prostate.  His bleeding resolved with Amicar.  Will restart.  Continue CBI for now.  He may eventually need a TURP.  CT urogram was apparently done last night and will review.   Electronic Signatures: SAbbie Sons(MD)  (Signed 04-Aug-14 08:20)  Authored: Chief Complaint, VITAL SIGNS/ANCILLARY NOTES, Brief Assessment, Lab Results, Assessment/Plan   Last Updated: 04-Aug-14 08:20 by SAbbie Sons(MD)

## 2014-09-16 NOTE — Op Note (Signed)
PATIENT NAME:  Lucy AntiguaSELLARS, Haris D MR#:  [id number] DATE OF BIRTH:  May 09, 1951  DATE OF PROCEDURE:  12/14/2012  PREOPERATIVE DIAGNOSIS: Gross hematuria with clot retention.   POSTOPERATIVE DIAGNOSIS:  Gross hematuria with clot retention.   PROCEDURES: 1.  Cystoscopy with clot evacuation.  2.  Fulguration prostate.   SURGEONLonna Cobb:  Stoioff, M.D.   ASSISTANT: None.   ANESTHESIA: General.   INDICATIONS FOR PROCEDURE: A 64 year old male admitted with gross hematuria clot retention. He was initially evaluated by Dr. Alben DeedsHauser and a 3-way Foley placed with a large amount of clot irrigated from the bladder. He has been on continuous bladder irrigation and still with moderate hematuria on moderate flow CBI. Thus, cystoscopy under anesthesia was recommended. Noncontrast CT scan showed mild, bilateral hydronephrosis, a distended bladder and mass within the bladder felt secondary to clot.   DESCRIPTION OF PROCEDURE:  The patient was taken to the operating room and placed in supine position. Anesthesia was obtained and he was then placed in the low lithotomy position. His external genitalia were prepped and draped in the usual sterile fashion. A timeout was performed per protocol with all in agreement. A 27-French continuous flow resectoscope sheath with visual obturator was lubricated and passed easily in the bladder. The Igleasias resectoscope with loop was then placed into the sheath. The prostate remarkable for lateral lobe enlargement and moderate median lobe. A small amount of clot was irrigated from the bladder. Bladder mucosa was closely inspected with 30 and 70 degree lenses. No bladder mucosal lesions were identified. The ureteral orifices were visualized and with clear efflux. There was noted to be an arterial bleeder at the bladder neck, which was cauterized. The bladder neck was hypervascular with several oozing vessels. The loop was replaced with a button electrode. Several layers of the bladder neck was  vaporized and small bleeders cauterized. Once bleeding was controlled, a repeat cystoscopy again shows no bladder mucosal abnormalities. The resectoscope was removed. A 24-French 3 way Foley catheter was placed. The catheter was irrigated with light rose' effluent obtained. The catheter was placed to moderate CBI with clear effluent. B and O suppositories were placed per rectum. The patient was then taken to PACU in stable condition. There were no complications.  ESTIMATED BLOOD LOSS:  Less than 50 mL     ____________________________ Verna CzechScott C. Lonna CobbStoioff, MD scs:ce D: 12/16/2012 12:40:37 ET T: 12/16/2012 13:26:52 ET JOB#: [id number]  cc: Lorin PicketScott C. Lonna CobbStoioff, MD, <Dictator> Riki AltesSCOTT C STOIOFF MD ELECTRONICALLY SIGNED 12/28/2012 8:30

## 2014-09-16 NOTE — H&P (Signed)
PATIENT NAME:  Richard Lamb, Richard Lamb MR#:  [id number] DATE OF BIRTH:  02-26-1951  DATE OF ADMISSION:  12/12/2012  REFERRING PHYSICIAN: Dorothea Glassman, MD  PRIMARY CARE PHYSICIAN: None.   CHIEF COMPLAINT: Hematuria.   HISTORY OF PRESENT ILLNESS: This is a 64 year old male without significant past medical history, who has not been following up with a physician, who presents with complaints of hematuria. The patient reports his problems started before a few weeks, when he presented to ED before 3 weeks, where he came to ED where he was diagnosed with UTI and discharged home on p.o. antibiotics. He reports his symptoms did resolve then, but reports over the last 2 days, his hematuria came back. As well, he was complaining of lower abdominal pain and crampy abdominal pain and urinary incontinence, dysuria and difficulty urinating, so he came to the ED. In the ED, the patient was found to have a drop in his hemoglobin from 16 to 13 grams with significant hematuria and blood in his underwear. The patient has had Foley catheter inserted with a 500 mL irrigation. As well, meanwhile, the patient was ordered CT abdomen and pelvis without contrast, which did show large blood clot within the bladder lumen in close proximity to ureterovesical junction that results in mild bilateral hydronephrosis. As well, he had extensive pericystic inflammatory changes consistent with cystitis, including hemorrhagic cystitis and prostatomegaly with large amount of mass effect and possible invasion of the bladder base, with differential including BPH versus malignancy. The patient's Foley was discontinued, where a 3-way Foley was attempted to insert, which was unsuccessful. ED discussed the case with urologist on-call, Dr. Richardson Landry, who requested the patient to be admitted under hospitalist service, where he will see the patient today, and requested the Foley catheter to be inserted back, where he will attempt to insert 3-way Foley when he first  sees the patient, and will have him on continuous bladder irrigation. The patient was found to have worsening renal function with creatinine of 1.36, where it used to be 1.01 before. As well, he had mild hypokalemia at potassium 3.2. The patient denies any fever or chills, weight gain, weight loss, cough, productive sputum, diarrhea or constipation.   PAST MEDICAL HISTORY: None.   PAST SURGICAL HISTORY: None.   SOCIAL HISTORY: He smokes 1 pack per day. Reports he used to drink alcohol in the past, average 1 beer per day, but he did not drink for the last few months.   FAMILY HISTORY: Denies any family history of diabetes, coronary artery disease or bladder cancer or prostate cancer.   ALLERGIES: SULFA DRUGS.   HOME MEDICATIONS: None.   REVIEW OF SYSTEMS: CONSTITUTIONAL: Denies fever, chills. Complains of generalized fatigue, weakness. Denies any weight gain, weight loss.  EYES: Denies blurry vision, double vision, inflammation, glaucoma.  ENT: Denies tinnitus, ear pain, hearing loss, epistaxis or discharge.  RESPIRATORY: Denies cough, wheezing, hemoptysis, dyspnea, COPD.  CARDIOVASCULAR: Denies chest pain, orthopnea, edema, palpitations, syncope.  GASTROINTESTINAL: Denies nausea, vomiting, diarrhea, hematemesis, melena, ulcers, rectal bleed, constipation.  GENITOURINARY: Complains of dysuria, urinary incontinence, hematuria, increased frequency and lower abdominal tenderness and cramping.  ENDOCRINE: Denies polyuria, polydipsia, heat or cold intolerance.  HEMATOLOGY: Denies anemia, easy bruising or bleeding diathesis.  INTEGUMENTARY: Denies acne, rash or skin lesions.  MUSCULOSKELETAL: Denies any arthritis, cramps, gout, limited activity.  NEUROLOGIC: Denies CVA, TIA, ataxia, dementia, epilepsy.  PSYCHIATRIC: Denies anxiety, insomnia, depression, substance abuse, alcohol abuse.   PHYSICAL EXAMINATION:  VITAL SIGNS: Temperature 97.4, pulse 70, respiratory  rate 16, blood pressure 182/97,  saturating 98% on room air.  GENERAL: Well-nourished male, looks comfortable, in no apparent distress.  HEENT: Head atraumatic, normocephalic. Pupils equally reactive to light. Pink conjunctivae. Anicteric sclerae. Moist oral mucosa.  NECK: Supple. No thyromegaly. No JVD. No carotid bruits.  CHEST: Good air entry bilaterally. No wheezing, rales or rhonchi.  CARDIOVASCULAR: S1, S2 heard. No rubs, murmurs or gallops.  ABDOMEN: Has mild tenderness in the lower abdominal area. As well, he has distended bladder. Bowel sounds present. No rebound, no guarding.  EXTREMITIES: No edema. No clubbing. No cyanosis. Has good pedal pulses.  GENITALS: Has some dried blood around the genital area from his hematuria.  PSYCHIATRIC: Appropriate affect. Awake and alert x3. Intact judgment and insight.  NEUROLOGIC: Cranial nerves grossly intact. Motor 5 out of 5.  SKIN: Normal skin turgor. Warm and dry.  MUSCULOSKELETAL: Has no joint effusion, tenderness or erythema.   PERTINENT LABORATORY DATA: Glucose 145, BUN 17, creatinine 1.34, sodium 134, potassium 3.2, chloride 103, CO2 23, anion gap 8, ALT 29, AST 25, alkaline phosphatase 53. White blood cells 8.1, hemoglobin 13.8, hematocrit 43.6, platelets 222. Urinalysis showing red blood cells with [id number].   IMAGING: The patient had CT abdomen and pelvis without contrast showing large blood clots within the bladder lumen in close proximity to the ureterovesical junction, results in mild bilateral hydronephrosis and extensive pericystic inflammatory changes consistent with cystitis, and prostatomegaly with large amount of mass effect and possible invasion into the bladder base, with differential diagnosis including benign prostatic hypertrophy versus prostatic malignancy.   ASSESSMENT AND PLAN:  1. Hematuria. The patient's CT abdomen and pelvis has evidence of large blood clots. Cause of his hematuria is unclear. It might be due to prostatic malignancy invading the bladder  wall versus hemorrhagic cystitis. ED discussed with urology, who for the time being will have the Foley catheter reinserted until the patient is seen and evaluated by urology, where he will need 3-way Foley catheter with continuous irrigation. As well, given the fact that the patient did drop 3 grams of hemoglobin from the previous value, will repeat another level in 12 hours and then tomorrow morning. As well, will have active type and screen.  2. Urinary retention, this is possibly due to his blood clot in his bladder, as well due to prostatic mass. Will have Foley catheter inserted as well.  3. Prostate mass. This is malignancy versus benign prostatic hypertrophy, but suspicion at this point is high for malignancy given the fact it has mass effect with invasion of the bladder base, so will check PSA, as well consulted urology.  4. Bilateral hydronephrosis. This is due to obstructive uropathy from the blood clot being at the vesicoureteral junction, as well due to bladder outlet obstruction from the bladder mass. Will have Foley catheter inserted. Will continue with irrigation.   5. Acute renal failure. This is due to obstructive uropathy. Will continue with fluids. Will try to correct his obstructive uropathy with the Foley catheter.  6. Tobacco abuse. The patient was counseled for 5 minutes. He is not interested to quit smoking.   7. Deep vein thrombosis prophylaxis. Will avoid chemical anticoagulation. Will have him on sequential compression device.   CODE STATUS: Full code.   TOTAL TIME SPENT ON ADMISSION AND PATIENT CARE: 55 minutes.   ____________________________ Starleen Armsawood S. Keili Hasten, MD dse:OSi D: 12/12/2012 07:22:38 ET T: 12/12/2012 07:40:25 ET JOB#: [id number]  cc: Starleen Armsawood S. Fatim Vanderschaaf, MD, <Dictator> Lamonta Cypress Teena IraniS Kayle Passarelli MD ELECTRONICALLY SIGNED  12/13/2012 6:13 

## 2018-03-02 ENCOUNTER — Ambulatory Visit (INDEPENDENT_AMBULATORY_CARE_PROVIDER_SITE_OTHER): Payer: 59 | Admitting: Urology

## 2018-03-02 ENCOUNTER — Encounter: Payer: Self-pay | Admitting: Urology

## 2018-03-02 VITALS — BP 174/97 | HR 97 | Ht 72.0 in | Wt 181.7 lb

## 2018-03-02 DIAGNOSIS — N410 Acute prostatitis: Secondary | ICD-10-CM | POA: Diagnosis not present

## 2018-03-02 DIAGNOSIS — N401 Enlarged prostate with lower urinary tract symptoms: Secondary | ICD-10-CM

## 2018-03-02 DIAGNOSIS — R300 Dysuria: Secondary | ICD-10-CM

## 2018-03-02 DIAGNOSIS — R3916 Straining to void: Secondary | ICD-10-CM | POA: Diagnosis not present

## 2018-03-02 LAB — MICROSCOPIC EXAMINATION: WBC, UA: NONE SEEN /hpf (ref 0–5)

## 2018-03-02 LAB — URINALYSIS, COMPLETE
Bilirubin, UA: NEGATIVE
Glucose, UA: NEGATIVE
Ketones, UA: NEGATIVE
LEUKOCYTES UA: NEGATIVE
Nitrite, UA: NEGATIVE
PH UA: 5 (ref 5.0–7.5)
Specific Gravity, UA: >1.03 — ABNORMAL HIGH (ref 1.005–1.030)
Urobilinogen, Ur: 0.2 mg/dL (ref 0.2–1.0)

## 2018-03-02 LAB — BLADDER SCAN AMB NON-IMAGING

## 2018-03-02 MED ORDER — CIPROFLOXACIN HCL 500 MG PO TABS: 500.0000 mg | ORAL_TABLET | Freq: Two times a day (BID) | ORAL | 0 refills | Status: AC

## 2018-03-02 NOTE — Progress Notes (Signed)
03/02/2018 10:10 AM   Richard Lamb Apr 13, 1951 [id number]  Referring provider: No referring provider defined for this encounter.  Chief Complaint  Patient presents with  . Establish Care  . Dysuria    HPI: I was consulted today to assess the patient's flow symptoms and pain at the end of urination.  Initially his flow was good.  At the end it stops and starts.  It then burns.  He will then shake the penis for several drops afterwards.  Otherwise he is continent  In 2014 he had a bladder stone and an enlarged prostate and he underwent transurethral removal of the stone and a greenlight procedure of the prostate.  It appeared he did well afterwards.  He no longer takes finasteride and Flomax.  This was done by Dr. Evelene Croon.  He no longer accepts his insurance  At baseline he voids every 2 hours gets up twice a night.  He has not noted blood in the urine  He denies kidney stones and bladder infections  Modifying factors: There are no other modifying factors  Associated signs and symptoms: There are no other associated signs and symptoms Aggravating and relieving factors: There are no other aggravating or relieving factors Severity: Moderate Duration: Persistent   PMH: History reviewed. No pertinent past medical history.  Surgical History: Past Surgical History:  Procedure Laterality Date  . PROSTATE SURGERY     Dr.Wolfe    Home Medications:  Allergies as of 03/02/2018      Reactions   Sulfa Antibiotics Anaphylaxis      Medication List    as of 03/02/2018 10:10 AM   You have not been prescribed any medications.     Allergies:  Allergies  Allergen Reactions  . Sulfa Antibiotics Anaphylaxis    Family History: History reviewed. No pertinent family history.  Social History:  reports that he has been smoking cigarettes. He has never used smokeless tobacco. He reports that he drinks about 7.0 standard drinks of alcohol per week. He reports that he does not use  drugs.  ROS: UROLOGY Frequent Urination?: Yes Hard to postpone urination?: Yes Burning/pain with urination?: Yes Get up at night to urinate?: Yes Leakage of urine?: Yes Urine stream starts and stops?: Yes Trouble starting stream?: Yes Do you have to strain to urinate?: Yes Blood in urine?: No Urinary tract infection?: No Sexually transmitted disease?: No Injury to kidneys or bladder?: No Painful intercourse?: No Weak stream?: No Erection problems?: No Penile pain?: No  Gastrointestinal Nausea?: No Vomiting?: No Indigestion/heartburn?: No Diarrhea?: No Constipation?: No  Constitutional Fever: No Night sweats?: No Weight loss?: No Fatigue?: No  Skin Skin rash/lesions?: No Itching?: No  Eyes Blurred vision?: No Double vision?: No  Ears/Nose/Throat Sore throat?: No Sinus problems?: No  Hematologic/Lymphatic Swollen glands?: No Easy bruising?: No  Cardiovascular Leg swelling?: No Chest pain?: No  Respiratory Cough?: No Shortness of breath?: No  Endocrine Excessive thirst?: No  Musculoskeletal Back pain?: No Joint pain?: No  Neurological Headaches?: No Dizziness?: No  Psychologic Depression?: No Anxiety?: No  Physical Exam: BP (!) 174/97 (BP Location: Left Arm, Patient Position: Sitting, Cuff Size: Normal)   Pulse 97   Ht 6' (1.829 m)   Wt 181 lb 11.2 oz (82.4 kg)   BMI 24.64 kg/m   Constitutional:  Alert and oriented, No acute distress. HEENT: [place] AT, moist mucus membranes.  Trachea midline, no masses. Cardiovascular: No clubbing, cyanosis, or edema. Respiratory: Normal respiratory effort, no increased work of breathing. GI:  Abdomen is soft, nontender, nondistended, no abdominal masses GU: No CVA tenderness.  Benign prostate 50 g but he was uncomfortable in the exam was limited. Skin: No rashes, bruises or suspicious lesions. Lymph: No cervical or inguinal adenopathy. Neurologic: Grossly intact, no focal deficits, moving all 4  extremities. Psychiatric: Normal mood and affect.  Laboratory Data: Lab Results  Component Value Date   WBC 8.2 12/30/2012   HGB 9.1 (L) 12/30/2012   HCT 26.5 (L) 12/30/2012   MCV 89 12/30/2012   PLT 287 12/30/2012    Lab Results  Component Value Date   CREATININE 1.21 12/29/2012    Lab Results  Component Value Date   PSA 6.3 (H) 12/12/2012    No results found for: TESTOSTERONE  No results found for: HGBA1C  Urinalysis    Component Value Date/Time   COLORURINE RED 12/26/2012 2149   APPEARANCEUR CLOUDY 12/26/2012 2149   LABSPEC 1.010 12/26/2012 2149   PHURINE see comment 12/26/2012 2149   GLUCOSEU see comment 12/26/2012 2149   HGBUR see comment 12/26/2012 2149   BILIRUBINUR see comment 12/26/2012 2149   KETONESUR see comment 12/26/2012 2149   PROTEINUR see comment 12/26/2012 2149   NITRITE SEE COMMENT 12/26/2012 2149   LEUKOCYTESUR see comment 12/26/2012 2149    Pertinent Imaging:   Assessment & Plan: The patient might have a bladder infection and prostatitis.  With the pain only at the end of urination he is at risk of a bladder stone with referred pain as an underlying etiology.  I decided to treat him as a prostatitis and send the urine for culture.  I would have him undergo cystoscopy in approximately 5 weeks x 1 of my colleagues since if he has a stone management could be considered then. Patient saw Dr Lonna Cobb years ago. Cipro 500 mg bid for 30 days  1. Dysuria  - Urinalysis, Complete  2. Benign prostatic hyperplasia (BPH) with straining on urination   - Bladder Scan (Post Void Residual) in office   No follow-ups on file.  Martina Sinner, MD  [facility] [address] [place], [place] [address] [phone]

## 2018-03-05 LAB — CULTURE, URINE COMPREHENSIVE

## 2018-03-27 ENCOUNTER — Ambulatory Visit: Payer: Self-pay | Admitting: Urology

## 2018-04-13 ENCOUNTER — Ambulatory Visit (INDEPENDENT_AMBULATORY_CARE_PROVIDER_SITE_OTHER): Payer: PPO | Admitting: Urology

## 2018-04-13 ENCOUNTER — Encounter: Payer: Self-pay | Admitting: Urology

## 2018-04-13 DIAGNOSIS — R3916 Straining to void: Secondary | ICD-10-CM

## 2018-04-13 DIAGNOSIS — R109 Unspecified abdominal pain: Secondary | ICD-10-CM

## 2018-04-13 DIAGNOSIS — N401 Enlarged prostate with lower urinary tract symptoms: Secondary | ICD-10-CM | POA: Diagnosis not present

## 2018-04-13 LAB — MICROSCOPIC EXAMINATION: WBC UA: NONE SEEN /HPF (ref 0–5)

## 2018-04-13 LAB — URINALYSIS, COMPLETE
Bilirubin, UA: NEGATIVE
Glucose, UA: NEGATIVE
Ketones, UA: NEGATIVE
LEUKOCYTES UA: NEGATIVE
Nitrite, UA: NEGATIVE
PH UA: 5.5 (ref 5.0–7.5)
PROTEIN UA: NEGATIVE
Specific Gravity, UA: 1.025 (ref 1.005–1.030)
UUROB: 0.2 mg/dL (ref 0.2–1.0)

## 2018-04-13 NOTE — Progress Notes (Signed)
04/13/2018 9:23 AM   Richard Lamb [birth year] [id number]  Referring provider: No referring provider defined for this encounter.  Chief Complaint  Patient presents with  . Cysto    HPI: I was consulted today to assess the patient's flow symptoms and pain at the end of urination.  Initially his flow was good.  At the end it stops and starts.  It then burns.  He will then shake the penis for several drops afterwards.  Otherwise he is continent  In 2014 he had a bladder stone and an enlarged prostate and he underwent transurethral removal of the stone and a greenlight procedure of the prostate.  It appeared he did well afterwards.  He no longer takes finasteride and Flomax.  This was done by Dr. Evelene CroonWolff.  He no longer accepts his insurance  At baseline he voids every 2 hours gets up twice a night.  He has not noted blood in the urine  The patient might have a bladder infection and prostatitis.  With the pain only at the end of urination he is at risk of a bladder stone with referred pain as an underlying etiology.  I decided to treat him as a prostatitis and send the urine for culture.  I would have him undergo cystoscopy in approximately 5 weeks x 1 of my colleagues since if he has a stone management could be considered then. Patient saw Dr Lonna CobbStoioff years ago. Cipro 500 mg bid for 30 days  Today The patient said he is dramatically better.  Flow is reasonable.  The pain is completely gone.  Clinically not infected today.  Flexible cystoscopy: After consent patient underwent sterile flexible cystoscopy.  Penile bulbar urethra normal.  He had some regrowth of the prostate.  Prostatic urethra was approximately 2 cm.  There was little bit of regrowth to the left of midline at the bladder neck area.  She had a bladder stone likely about 2 cm in size.  He had mild erythema of the mucosa but the trigone and bladder mucosa otherwise looked normal.     PMH: History reviewed. No pertinent past  medical history.  Surgical History: Past Surgical History:  Procedure Laterality Date  . PROSTATE SURGERY     Dr.Wolfe    Home Medications:  Allergies as of 04/13/2018      Reactions   Sulfa Antibiotics Anaphylaxis      Medication List    as of 04/13/2018  9:23 AM   You have not been prescribed any medications.     Allergies:  Allergies  Allergen Reactions  . Sulfa Antibiotics Anaphylaxis    Family History: History reviewed. No pertinent family history.  Social History:  reports that he has been smoking cigarettes. He has never used smokeless tobacco. He reports that he drinks about 7.0 standard drinks of alcohol per week. He reports that he does not use drugs.  ROS:                                        Physical Exam: There were no vitals taken for this visit.  Constitutional:  Alert and oriented, No acute distress.   Laboratory Data: Lab Results  Component Value Date   WBC 8.2 12/30/2012   HGB 9.1 (L) 12/30/2012   HCT 26.5 (L) 12/30/2012   MCV 89 12/30/2012   PLT 287 12/30/2012    Lab Results  Component  Value Date   CREATININE 1.21 12/29/2012    Lab Results  Component Value Date   PSA 6.3 (H) 12/12/2012    No results found for: TESTOSTERONE  No results found for: HGBA1C  Urinalysis    Component Value Date/Time   COLORURINE RED 12/26/2012 2149   APPEARANCEUR Cloudy (A) 03/02/2018 0945   LABSPEC 1.010 12/26/2012 2149   PHURINE see comment 12/26/2012 2149   GLUCOSEU Negative 03/02/2018 0945   GLUCOSEU see comment 12/26/2012 2149   HGBUR see comment 12/26/2012 2149   BILIRUBINUR Negative 03/02/2018 0945   BILIRUBINUR see comment 12/26/2012 2149   KETONESUR see comment 12/26/2012 2149   PROTEINUR 2+ (A) 03/02/2018 0945   PROTEINUR see comment 12/26/2012 2149   NITRITE Negative 03/02/2018 0945   NITRITE SEE COMMENT 12/26/2012 2149   LEUKOCYTESUR Negative 03/02/2018 0945   LEUKOCYTESUR see comment 12/26/2012 2149      Pertinent Imaging:   Assessment & Plan: The patient has a bladder stone.  He has had a prostate procedure in the past and likely a transurethral removal of the bladder stone as a solo procedure would be prudent.  One could then get residuals afterwards.  He may have a poorly contractile bladder with elevated residuals.  The role of urodynamics may be limited with a stone in place because it was large enough to have ball-valve effect.  I will have him see Dr. Lonna Cobb with a KUB and postvoid residual with the stone in situ.  Further thought I did not get a KUB but I did order a stone protocol CT scan to assess upper tracts for any stones or hydronephrosis and get an excellent look of the bladder of any calculi  1. Benign prostatic hyperplasia (BPH) with straining on urination  - Urinalysis, Complete   No follow-ups on file.  Martina Sinner, MD  [facility] [address], [address] [place], [place] [address] [phone]

## 2018-04-17 ENCOUNTER — Ambulatory Visit
Admission: RE | Admit: 2018-04-17 | Discharge: 2018-04-17 | Disposition: A | Discharge status: Home / Self Care | Payer: PPO | Source: Ambulatory Visit | Attending: Urology | Admitting: Urology

## 2018-04-17 DIAGNOSIS — R109 Unspecified abdominal pain: Secondary | ICD-10-CM | POA: Diagnosis not present

## 2018-04-27 ENCOUNTER — Telehealth: Payer: Self-pay | Admitting: Urology

## 2018-04-27 ENCOUNTER — Other Ambulatory Visit: Payer: PPO

## 2018-04-27 DIAGNOSIS — R300 Dysuria: Secondary | ICD-10-CM | POA: Diagnosis not present

## 2018-04-27 LAB — MICROSCOPIC EXAMINATION

## 2018-04-27 LAB — URINALYSIS, COMPLETE
BILIRUBIN UA: NEGATIVE
GLUCOSE, UA: NEGATIVE
KETONES UA: NEGATIVE
Leukocytes, UA: NEGATIVE
NITRITE UA: NEGATIVE
UUROB: 0.2 mg/dL (ref 0.2–1.0)
pH, UA: 5 (ref 5.0–7.5)

## 2018-04-27 NOTE — Progress Notes (Signed)
Per Michiel CowboyShannon McGowan, PAC will wait to treat and call patient with culture results, patient notified of this and was told to utilize AZO OTC if needed

## 2018-04-27 NOTE — Telephone Encounter (Signed)
Pt called and stated he's having problems finishing peeing and when he finishes, it burns.

## 2018-04-30 LAB — CULTURE, URINE COMPREHENSIVE

## 2018-06-03 ENCOUNTER — Encounter: Payer: Self-pay | Admitting: Urology

## 2018-06-03 ENCOUNTER — Ambulatory Visit: Payer: PPO | Admitting: Urology

## 2018-06-03 VITALS — BP 189/109 | HR 88 | Ht 72.0 in | Wt 176.6 lb

## 2018-06-03 DIAGNOSIS — R399 Unspecified symptoms and signs involving the genitourinary system: Secondary | ICD-10-CM | POA: Insufficient documentation

## 2018-06-03 DIAGNOSIS — N210 Calculus in bladder: Secondary | ICD-10-CM | POA: Diagnosis not present

## 2018-06-03 LAB — BLADDER SCAN AMB NON-IMAGING

## 2018-06-03 NOTE — H&P (View-Only) (Signed)
06/03/2018 8:45 PM   Richard Lamb September 21, 1950 [id number]  Referring provider: No referring provider defined for this encounter.  Chief Complaint  Patient presents with  . Benign Prostatic Hypertrophy    HPI: Richard Lamb is a 68 yo M with a history of BPH with straining on urination presents today for a 1 month f/u.  -Difficulties with finishing urination followed by burning sensation.  -CT from 04/17/2018 showed 2.6 cm calculus within urinary bladder.  No nephrolithiasis -Cystoscopy by Dr. Sherron Monday last visit showed slight adenoma regrowth and bladder calculus -Previous history 2014 cystolitholapaxy and PVP by Dr. Evelene Croon  PMH: Past Medical History:  Diagnosis Date  . Anxiety   . GERD (gastroesophageal reflux disease)   . Hypertension     Surgical History: Past Surgical History:  Procedure Laterality Date  . PROSTATE SURGERY     Dr.Wolfe    Home Medications:  Allergies as of 06/03/2018      Reactions   Sulfa Antibiotics Anaphylaxis      Medication List    as of June 03, 2018 11:59 PM   You have not been prescribed any medications.     Allergies:  Allergies  Allergen Reactions  . Sulfa Antibiotics Anaphylaxis    Family History: History reviewed. No pertinent family history.  Social History:  reports that he has been smoking cigarettes. He has been smoking about 0.50 packs per day. He has never used smokeless tobacco. He reports current alcohol use of about 7.0 standard drinks of alcohol per week. He reports current drug use. Drug: Marijuana.  ROS: UROLOGY Frequent Urination?: Yes Hard to postpone urination?: No Burning/pain with urination?: Yes Get up at night to urinate?: No Leakage of urine?: No Urine stream starts and stops?: No Trouble starting stream?: No Do you have to strain to urinate?: No Blood in urine?: No Urinary tract infection?: No Sexually transmitted disease?: No Injury to kidneys or bladder?: No Painful intercourse?:  No Weak stream?: No Erection problems?: No Penile pain?: No  Gastrointestinal Nausea?: No Vomiting?: No Indigestion/heartburn?: No Diarrhea?: No Constipation?: No  Constitutional Fever: No Night sweats?: No Weight loss?: No Fatigue?: No  Skin Skin rash/lesions?: No Itching?: No  Eyes Blurred vision?: No Double vision?: No  Ears/Nose/Throat Sore throat?: No Sinus problems?: No  Hematologic/Lymphatic Swollen glands?: No Easy bruising?: No  Cardiovascular Leg swelling?: No Chest pain?: No  Respiratory Cough?: No Shortness of breath?: No  Endocrine Excessive thirst?: No  Musculoskeletal Back pain?: No Joint pain?: No  Neurological Headaches?: No Dizziness?: No  Psychologic Depression?: No Anxiety?: No  Physical Exam: BP (!) 189/109 (BP Location: Left Arm, Patient Position: Sitting, Cuff Size: Normal)   Pulse 88   Ht 6' (1.829 m)   Wt 176 lb 9.6 oz (80.1 kg)   BMI 23.95 kg/m   Constitutional:  Alert and oriented, No acute distress. HEENT: [place] AT, moist mucus membranes.  Trachea midline, no masses. Cardiovascular: No clubbing, cyanosis, or edema.  RRR Respiratory: Normal respiratory effort, no increased work of breathing.  Clear GI: Abdomen is soft, nontender, nondistended, no abdominal masses GU: No CVA tenderness Lymph: No cervical or inguinal lymphadenopathy. Skin: No rashes, bruises or suspicious lesions. Neurologic: Grossly intact, no focal deficits, moving all 4 extremities. Psychiatric: Normal mood and affect.  Pertinent Imaging: CLINICAL DATA:  Flank and abdominal pain. Large bladder calculus on recent cystoscopy.  EXAM: CT ABDOMEN AND PELVIS WITHOUT CONTRAST  TECHNIQUE: Multidetector CT imaging of the abdomen and pelvis was performed following the  standard protocol without IV contrast.  COMPARISON:  12/27/2012  FINDINGS: Lower chest: No acute findings.  Hepatobiliary: No mass visualized on this unenhanced  exam. Gallbladder is unremarkable.  Pancreas: No mass or inflammatory process visualized on this unenhanced exam.  Spleen:  Within normal limits in size.  Adrenals/Urinary tract: No evidence of renal calculi or hydronephrosis. A 2.6 cm calculus is seen within the urinary bladder.  Stomach/Bowel: No evidence of obstruction, inflammatory process, or abnormal fluid collections. Mild colonic diverticulosis is noted, however there is no evidence of diverticulitis.  Vascular/Lymphatic: No pathologically enlarged lymph nodes identified. No evidence of abdominal aortic aneurysm. Aortic atherosclerosis.  Reproductive: Normal size prostate. No mass or other significant abnormality.  Other: Small bilateral inguinal hernias and small paraumbilical ventral hernia are again seen, which contain only fat. No herniated bowel loops.  Musculoskeletal: No suspicious bone lesions identified. Bilateral L5 pars defects are seen with severe degenerative disc disease and grade 1 anterolisthesis at L5-S1.  IMPRESSION: 2.6 cm calculus within the urinary bladder.  No acute findings. No evidence of ureteral calculi or hydronephrosis.  Colonic diverticulosis, without radiographic evidence of diverticulitis.  Stable small fat-containing bilateral inguinal and paraumbilical ventral hernias.   Electronically Signed   By: John  Stahl M.D.   On: 04/17/2018 15:10  I have personally reviewed the CT. Bladder calculus noted.   Assessment & Plan:  1. Bladder calculus -He did not have significant prostatic regrowth on recent cystoscopy and his bladder calculus may be contributing to the majority of his lower urinary tract symptoms.  I recommended scheduling cystolitholapaxy.  The procedure was discussed in detail including potential risks of bleeding, infection, bladder injury and urethral stricture.  He indicated all questions were answered and desires to proceed.   Rashaud Ybarbo C Donavon Kimrey,  MD  [facility] [address], [address] [place], [place] [address] [phone]  I, Nethusan Sivanesan, am acting as a scribe for Dr. Janesia Joswick C. Kealani Leckey,  I, Jacaden Forbush C Kiah Vanalstine, MD, have reviewed all documentation for this visit. The documentation on 06/17/18 for the exam, diagnosis, procedures, and orders are all accurate and complete.    

## 2018-06-03 NOTE — Progress Notes (Addendum)
06/03/2018 8:45 PM   Richard Lamb September 21, 1950 [id number]  Referring provider: No referring provider defined for this encounter.  Chief Complaint  Patient presents with  . Benign Prostatic Hypertrophy    HPI: Richard Lamb is a 68 yo M with a history of BPH with straining on urination presents today for a 1 month f/u.  -Difficulties with finishing urination followed by burning sensation.  -CT from 04/17/2018 showed 2.6 cm calculus within urinary bladder.  No nephrolithiasis -Cystoscopy by Dr. Sherron Monday last visit showed slight adenoma regrowth and bladder calculus -Previous history 2014 cystolitholapaxy and PVP by Dr. Evelene Croon  PMH: Past Medical History:  Diagnosis Date  . Anxiety   . GERD (gastroesophageal reflux disease)   . Hypertension     Surgical History: Past Surgical History:  Procedure Laterality Date  . PROSTATE SURGERY     Dr.Wolfe    Home Medications:  Allergies as of 06/03/2018      Reactions   Sulfa Antibiotics Anaphylaxis      Medication List    as of June 03, 2018 11:59 PM   You have not been prescribed any medications.     Allergies:  Allergies  Allergen Reactions  . Sulfa Antibiotics Anaphylaxis    Family History: History reviewed. No pertinent family history.  Social History:  reports that he has been smoking cigarettes. He has been smoking about 0.50 packs per day. He has never used smokeless tobacco. He reports current alcohol use of about 7.0 standard drinks of alcohol per week. He reports current drug use. Drug: Marijuana.  ROS: UROLOGY Frequent Urination?: Yes Hard to postpone urination?: No Burning/pain with urination?: Yes Get up at night to urinate?: No Leakage of urine?: No Urine stream starts and stops?: No Trouble starting stream?: No Do you have to strain to urinate?: No Blood in urine?: No Urinary tract infection?: No Sexually transmitted disease?: No Injury to kidneys or bladder?: No Painful intercourse?:  No Weak stream?: No Erection problems?: No Penile pain?: No  Gastrointestinal Nausea?: No Vomiting?: No Indigestion/heartburn?: No Diarrhea?: No Constipation?: No  Constitutional Fever: No Night sweats?: No Weight loss?: No Fatigue?: No  Skin Skin rash/lesions?: No Itching?: No  Eyes Blurred vision?: No Double vision?: No  Ears/Nose/Throat Sore throat?: No Sinus problems?: No  Hematologic/Lymphatic Swollen glands?: No Easy bruising?: No  Cardiovascular Leg swelling?: No Chest pain?: No  Respiratory Cough?: No Shortness of breath?: No  Endocrine Excessive thirst?: No  Musculoskeletal Back pain?: No Joint pain?: No  Neurological Headaches?: No Dizziness?: No  Psychologic Depression?: No Anxiety?: No  Physical Exam: BP (!) 189/109 (BP Location: Left Arm, Patient Position: Sitting, Cuff Size: Normal)   Pulse 88   Ht 6' (1.829 m)   Wt 176 lb 9.6 oz (80.1 kg)   BMI 23.95 kg/m   Constitutional:  Alert and oriented, No acute distress. HEENT: [place] AT, moist mucus membranes.  Trachea midline, no masses. Cardiovascular: No clubbing, cyanosis, or edema.  RRR Respiratory: Normal respiratory effort, no increased work of breathing.  Clear GI: Abdomen is soft, nontender, nondistended, no abdominal masses GU: No CVA tenderness Lymph: No cervical or inguinal lymphadenopathy. Skin: No rashes, bruises or suspicious lesions. Neurologic: Grossly intact, no focal deficits, moving all 4 extremities. Psychiatric: Normal mood and affect.  Pertinent Imaging: CLINICAL DATA:  Flank and abdominal pain. Large bladder calculus on recent cystoscopy.  EXAM: CT ABDOMEN AND PELVIS WITHOUT CONTRAST  TECHNIQUE: Multidetector CT imaging of the abdomen and pelvis was performed following the  standard protocol without IV contrast.  COMPARISON:  12/27/2012  FINDINGS: Lower chest: No acute findings.  Hepatobiliary: No mass visualized on this unenhanced  exam. Gallbladder is unremarkable.  Pancreas: No mass or inflammatory process visualized on this unenhanced exam.  Spleen:  Within normal limits in size.  Adrenals/Urinary tract: No evidence of renal calculi or hydronephrosis. A 2.6 cm calculus is seen within the urinary bladder.  Stomach/Bowel: No evidence of obstruction, inflammatory process, or abnormal fluid collections. Mild colonic diverticulosis is noted, however there is no evidence of diverticulitis.  Vascular/Lymphatic: No pathologically enlarged lymph nodes identified. No evidence of abdominal aortic aneurysm. Aortic atherosclerosis.  Reproductive: Normal size prostate. No mass or other significant abnormality.  Other: Small bilateral inguinal hernias and small paraumbilical ventral hernia are again seen, which contain only fat. No herniated bowel loops.  Musculoskeletal: No suspicious bone lesions identified. Bilateral L5 pars defects are seen with severe degenerative disc disease and grade 1 anterolisthesis at L5-S1.  IMPRESSION: 2.6 cm calculus within the urinary bladder.  No acute findings. No evidence of ureteral calculi or hydronephrosis.  Colonic diverticulosis, without radiographic evidence of diverticulitis.  Stable small fat-containing bilateral inguinal and paraumbilical ventral hernias.   Electronically Signed   By: Myles Rosenthal M.D.   On: 04/17/2018 15:10  I have personally reviewed the CT. Bladder calculus noted.   Assessment & Plan:  1. Bladder calculus -He did not have significant prostatic regrowth on recent cystoscopy and his bladder calculus may be contributing to the majority of his lower urinary tract symptoms.  I recommended scheduling cystolitholapaxy.  The procedure was discussed in detail including potential risks of bleeding, infection, bladder injury and urethral stricture.  He indicated all questions were answered and desires to proceed.   Riki Altes,  MD  [facility] [address] [place], [place] [address] [phone]  I, Donne Hazel, am acting as a scribe for Dr. Lorin Picket C. Kalub Morillo,  I, Riki Altes, MD, have reviewed all documentation for this visit. The documentation on 06/17/18 for the exam, diagnosis, procedures, and orders are all accurate and complete.

## 2018-06-03 NOTE — Addendum Note (Signed)
Addended by: Frankey Shown on: 06/03/2018 02:58 PM   Modules accepted: Orders

## 2018-06-08 ENCOUNTER — Other Ambulatory Visit: Payer: Self-pay | Admitting: Radiology

## 2018-06-08 DIAGNOSIS — N210 Calculus in bladder: Secondary | ICD-10-CM

## 2018-06-16 ENCOUNTER — Other Ambulatory Visit: Payer: Self-pay

## 2018-06-16 ENCOUNTER — Encounter
Admission: RE | Admit: 2018-06-16 | Discharge: 2018-06-16 | Disposition: A | Discharge status: Home / Self Care | Payer: PPO | Source: Ambulatory Visit | Attending: Urology | Admitting: Urology

## 2018-06-16 DIAGNOSIS — Z01818 Encounter for other preprocedural examination: Secondary | ICD-10-CM | POA: Diagnosis not present

## 2018-06-16 DIAGNOSIS — I10 Essential (primary) hypertension: Secondary | ICD-10-CM | POA: Insufficient documentation

## 2018-06-16 DIAGNOSIS — Z01810 Encounter for preprocedural cardiovascular examination: Secondary | ICD-10-CM | POA: Diagnosis not present

## 2018-06-16 HISTORY — DX: Essential (primary) hypertension: I10

## 2018-06-16 HISTORY — DX: Anxiety disorder, unspecified: F41.9

## 2018-06-16 HISTORY — DX: Gastro-esophageal reflux disease without esophagitis: K21.9

## 2018-06-16 LAB — CBC
HEMATOCRIT: 53.9 % — AB (ref 39.0–52.0)
HEMOGLOBIN: 17.1 g/dL — AB (ref 13.0–17.0)
MCH: 30.2 pg (ref 26.0–34.0)
MCHC: 31.7 g/dL (ref 30.0–36.0)
MCV: 95.2 fL (ref 80.0–100.0)
NRBC: 0 % (ref 0.0–0.2)
Platelets: 216 10*3/uL (ref 150–400)
RBC: 5.66 MIL/uL (ref 4.22–5.81)
RDW: 13.7 % (ref 11.5–15.5)
WBC: 5.2 10*3/uL (ref 4.0–10.5)

## 2018-06-16 LAB — BASIC METABOLIC PANEL
ANION GAP: 5 (ref 5–15)
BUN: 17 mg/dL (ref 8–23)
CHLORIDE: 106 mmol/L (ref 98–111)
CO2: 29 mmol/L (ref 22–32)
CREATININE: 1.14 mg/dL (ref 0.61–1.24)
Calcium: 9.4 mg/dL (ref 8.9–10.3)
GFR calc non Af Amer: >60 mL/min (ref >60)
Glucose, Bld: 106 mg/dL — ABNORMAL HIGH (ref 70–99)
Potassium: 3.9 mmol/L (ref 3.5–5.1)
SODIUM: 140 mmol/L (ref 135–145)

## 2018-06-16 NOTE — Patient Instructions (Addendum)
Your procedure is scheduled on: 06/30/18 Tues Report to Same Day Surgery 2nd floor medical mall [facility] Entrance-take elevator on left to 2nd floor.  Check in with surgery information desk.) To find out your arrival time please call [phone] between 1PM - 3PM on 06/29/18 Mon  Remember: Instructions that are not followed completely may result in serious medical risk, up to and including death, or upon the discretion of your surgeon and anesthesiologist your surgery may need to be rescheduled.    _x___ 1. Do not eat food after midnight the night before your procedure. You may drink clear liquids up to 2 hours before you are scheduled to arrive at the hospital for your procedure.  Do not drink clear liquids within 2 hours of your scheduled arrival to the hospital.  Clear liquids include  --Water or Apple juice without pulp  --Clear carbohydrate beverage such as ClearFast or Gatorade  --Black Coffee or Clear Tea (No milk, no creamers, do not add anything to                  the coffee or Tea Type 1 and type 2 diabetics should only drink water.   ____Ensure clear carbohydrate drink on the way to the hospital for bariatric patients  ____Ensure clear carbohydrate drink 3 hours before surgery for Dr Rutherford Nail patients if physician instructed.   No gum chewing or hard candies.     __x__ 2. No Alcohol for 24 hours before or after surgery.   __x__3. No Smoking or e-cigarettes for 24 prior to surgery.  Do not use any chewable tobacco products for at least 6 hour prior to surgery   ____  4. Bring all medications with you on the day of surgery if instructed.    __x__ 5. Notify your doctor if there is any change in your medical condition     (cold, fever, infections).    x___6. On the morning of surgery brush your teeth with toothpaste and water.  You may rinse your mouth with mouth wash if you wish.  Do not swallow any toothpaste or mouthwash.   Do not wear jewelry, make-up, hairpins,  clips or nail polish.  Do not wear lotions, powders, or perfumes. You may wear deodorant.  Do not shave 48 hours prior to surgery. Men may shave face and neck.  Do not bring valuables to the hospital.    [facility] is not responsible for any belongings or valuables.               Contacts, dentures or bridgework may not be worn into surgery.  Leave your suitcase in the car. After surgery it may be brought to your room.  For patients admitted to the hospital, discharge time is determined by your                       treatment team.  _  Patients discharged the day of surgery will not be allowed to drive home.  You will need someone to drive you home and stay with you the night of your procedure.    Please read over the following fact sheets that you were given:   [facility] Preparing for Surgery and or MRSA Information   _x___ Take anti-hypertensive listed below, cardiac, seizure, asthma,     anti-reflux and psychiatric medicines. These include:  1. None  2.  3.  4.  5.  6.  ____Fleets enema or Magnesium Citrate as directed.  ____ Use CHG Soap or sage wipes as directed on instruction sheet   ____ Use inhalers on the day of surgery and bring to hospital day of surgery  ____ Stop Metformin and Janumet 2 days prior to surgery.    ____ Take 1/2 of usual insulin dose the night before surgery and none on the morning     surgery.   _x___ Follow recommendations from Cardiologist, Pulmonologist or PCP regarding          stopping Aspirin, Coumadin, Plavix ,Eliquis, Effient, or Pradaxa, and Pletal.  X____Stop Anti-inflammatories such as Advil, Aleve, Ibuprofen, Motrin, Naproxen, Naprosyn, Goodies powders or aspirin products. OK to take Tylenol and                          Celebrex.   _x___ Stop supplements until after surgery.  But may continue Vitamin D, Vitamin B,       and multivitamin.   ____ Bring C-Pap to the hospital.

## 2018-06-16 NOTE — Pre-Procedure Instructions (Signed)
Notified Dr Pernell DupreAdams regarding medical history and abnormal EKG. Patient denies chest pain or SOB. "Need medical clearance." Dr Heywood FootmanStoioff's office notified regarding the above mentioned by fax and text.

## 2018-06-17 LAB — URINE CULTURE: Culture: <10000 — AB

## 2018-06-23 ENCOUNTER — Ambulatory Visit (INDEPENDENT_AMBULATORY_CARE_PROVIDER_SITE_OTHER): Payer: PPO | Admitting: Physician Assistant

## 2018-06-23 ENCOUNTER — Encounter: Payer: Self-pay | Admitting: Physician Assistant

## 2018-06-23 VITALS — BP 170/100 | HR 89 | Temp 98.9°F | Ht 73.0 in | Wt 182.2 lb

## 2018-06-23 DIAGNOSIS — R9431 Abnormal electrocardiogram [ECG] [EKG]: Secondary | ICD-10-CM

## 2018-06-23 DIAGNOSIS — Z01818 Encounter for other preprocedural examination: Secondary | ICD-10-CM | POA: Diagnosis not present

## 2018-06-23 DIAGNOSIS — I10 Essential (primary) hypertension: Secondary | ICD-10-CM | POA: Diagnosis not present

## 2018-06-23 DIAGNOSIS — Z13220 Encounter for screening for lipoid disorders: Secondary | ICD-10-CM | POA: Diagnosis not present

## 2018-06-23 DIAGNOSIS — Z720 Tobacco use: Secondary | ICD-10-CM | POA: Diagnosis not present

## 2018-06-23 DIAGNOSIS — R683 Clubbing of fingers: Secondary | ICD-10-CM | POA: Diagnosis not present

## 2018-06-23 MED ORDER — AMLODIPINE BESYLATE 5 MG PO TABS: ORAL_TABLET | ORAL | 0 refills | Status: DC

## 2018-06-23 NOTE — Progress Notes (Signed)
Patient: Richard AlbinoOtis Daniel Man, Male    DOB: [birth year], 68 y.o.   MRN: [id number] Visit Date: 06/23/2018  Today's Provider: Trey SailorsAdriana M Kinisha Soper, PA-C   Chief Complaint  Patient presents with  . New Patient (Initial Visit)   Subjective:    New Patient Appointment Richard Lamb is a 68 y.o. male who presents today for new patient appointment. Patient states that he is having a stone removed on 06/30/2018 from his bladder and is here for surgical clearance today. He feels well. He reports exercising includes walking. He reports he is sleeping well. He is living in [place]. Works at a golf course. He has three grown children. He has not been to a primary care doctor in many years. Denies history of colon cancer screening.   He has a calculus in his bladder that he has seen Dr. Irineo AxonScott Stoioff at urology for. He is scheduled for cystoscopy with litholapaxy on 06/30/2018. He has been sent here for optimization prior to surgery. He has had elevated PSA with previous prostate biopsy in 02/25/2013 showing BPH.   HTN: He has had hypertensive BP readings for at least three recorded months now. He denies ever being treated for hypertension. He denies chest pain, headaches, urinary issues.   BP Readings from Last 3 Encounters:  06/23/18 (!) 170/100  06/03/18 (!) 189/109  03/02/18 (!) 174/97    Abnormal EKG: He had an EKG performed on 06/16/2018 inverted T-waves in leads II, III, avf, V5, V6 that are new in comparison to 2014 EKG and confirmed on today's EKG. He denies chest pain at rest or with activity. He has smoked one half pack per day since age [age] and continues to do so. He denies known history of MI.   -----------------------------------------------------------------   Review of Systems  Constitutional: Negative.   HENT: Negative.   Eyes: Negative.   Respiratory: Negative.   Cardiovascular: Negative.   Gastrointestinal: Negative.   Endocrine: Negative.   Genitourinary: Negative.    Musculoskeletal: Negative.   Skin: Negative.   Allergic/Immunologic: Negative.   Neurological: Negative.   Hematological: Negative.   Psychiatric/Behavioral: Negative.     Social History He  reports that he has been smoking cigarettes. He has been smoking about 0.50 packs per day. He has never used smokeless tobacco. He reports current alcohol use of about 7.0 standard drinks of alcohol per week. He reports current drug use. Drug: Marijuana. Social History   Socioeconomic History  . Marital status: Single    Spouse name: Not on file  . Number of children: Not on file  . Years of education: Not on file  . Highest education level: Not on file  Occupational History  . Not on file  Social Needs  . Financial resource strain: Not on file  . Food insecurity:    Worry: Not on file    Inability: Not on file  . Transportation needs:    Medical: Not on file    Non-medical: Not on file  Tobacco Use  . Smoking status: Heavy Tobacco Smoker    Packs/day: 0.50    Types: Cigarettes  . Smokeless tobacco: Never Used  Substance and Sexual Activity  . Alcohol use: Yes    Alcohol/week: 7.0 standard drinks    Types: 7 Cans of beer per week  . Drug use: Yes    Types: Marijuana  . Sexual activity: Yes    Birth control/protection: None  Lifestyle  . Physical activity:    Days  per week: Not on file    Minutes per session: Not on file  . Stress: Not on file  Relationships  . Social connections:    Talks on phone: Not on file    Gets together: Not on file    Attends religious service: Not on file    Active member of club or organization: Not on file    Attends meetings of clubs or organizations: Not on file    Relationship status: Not on file  Other Topics Concern  . Not on file  Social History Narrative  . Not on file    Patient Active Problem List   Diagnosis Date Noted  . Calculus of bladder 06/03/2018  . Lower urinary tract symptoms 06/03/2018    Past Surgical History:    Procedure Laterality Date  . PROSTATE SURGERY     Dr.Wolfe    Family History  No family status information on file.   His family history is not on file.     Allergies  Allergen Reactions  . Sulfa Antibiotics Anaphylaxis    Previous Medications   No medications on file    Patient Care Team: [name] as PCP - General (Physician Assistant)      Objective:   Vitals: BP (!) 170/100 (BP Location: Left Arm, Patient Position: Sitting, Cuff Size: Normal)   Pulse 89   Temp 98.9 F (37.2 C) (Oral)   Ht 6\' 1"  (1.854 m)   Wt 182 lb 3.2 oz (82.6 kg)   SpO2 98%   BMI 24.04 kg/m    Physical Exam Constitutional:      General: He is not in acute distress.    Appearance: Normal appearance.  Cardiovascular:     Rate and Rhythm: Normal rate and regular rhythm.     Heart sounds: Normal heart sounds. No murmur.  Pulmonary:     Effort: Pulmonary effort is normal.     Breath sounds: No wheezing or rhonchi.     Comments: Poor air entry bilaterally.  Musculoskeletal:     Comments: Clubbed fingers bilaterally.   Skin:    General: Skin is warm and dry.  Neurological:     General: No focal deficit present.     Mental Status: He is alert and oriented to person, place, and time.  Psychiatric:        Mood and Affect: Mood normal.        Behavior: Behavior normal.      Depression Screen PHQ 2/9 Scores 06/23/2018  PHQ - 2 Score 0  PHQ- 9 Score 0      Assessment & Plan:     Routine Health Maintenance and Physical Exam  Exercise Activities and Dietary recommendations Goals   None      There is no immunization history on file for this patient.  Health Maintenance  Topic Date Due  . Hepatitis C Screening  02-02-1951  . HIV Screening  04/19/1968  . TETANUS/TDAP  04/19/1972  . COLONOSCOPY  04/20/2003  . INFLUENZA VACCINE  12/25/2017  . PNA vac Low Risk Adult (1 of 2 - PCV13) 04/19/2018     Discussed health benefits of physical activity, and  encouraged him to engage in regular exercise appropriate for his age and condition.   1. Hypertension, unspecified type  Uncontrolled HTN, asymptomatic today. Start amlodipine as below and titrate up to 10 mg. See back in one week to check BP.  - amLODipine (NORVASC) 5 MG tablet; Take 5 mg daily for  three days. Then on day 4, take 10 mg or two pills daily.  Dispense: 90 tablet; Refill: 0 - Ambulatory referral to Cardiology - EKG 12-Lead  2. Pre-op evaluation  He has uncontrolled HTN and we have started amlodipine. He smokes 1/2 pack daily for 50+ years. Additionally, he has inverted T-waves on EKG that are new since 2014 and indicate ischemia. He will need further workup and evaluation of this by cardiology due to multiple risk factors including age, uncontrolled HTN, tobacco abuse, african [place] race, and male gender. He has been referred to [facility] cardiology urgently and will need to be cleared by cardiology to proceed with surgery. In light of this, he has been DENIED surgical clearance for his procedure on 06/30/2018 pending further cardiology workup. Form will be signed and faxed along with clinic notes to urology.  - Ambulatory referral to Cardiology  3. Lipid screening  Further stratify risk and assess need for statin.  - Lipid Profile  4. Tobacco abuse  Continues to smoke.  5. Finger clubbing  Evident on exam, possibly due to chronic lung disease from smoking. Will need to address further at follow up.  6. Abnormal EKG  See #2.  Return in about 1 week (around 06/30/2018) for BP.  The entirety of the information documented in the History of Present Illness, Review of Systems and Physical Exam were personally obtained by me. Portions of this information were initially documented by [facility], CMA and reviewed by me for thoroughness and accuracy.       --------------------------------------------------------------------

## 2018-06-23 NOTE — Patient Instructions (Signed)
We are starting amlodipine for blood pressure. Take 5 mg or one pill daily for three days. Then take 10 mg or two pills daily onward.

## 2018-06-24 ENCOUNTER — Telehealth: Payer: Self-pay | Admitting: Urology

## 2018-06-24 LAB — LIPID PANEL
Chol/HDL Ratio: 3.2 ratio (ref 0.0–5.0)
Cholesterol, Total: 216 mg/dL — ABNORMAL HIGH (ref 100–199)
HDL: 68 mg/dL (ref >39)
LDL Calculated: 136 mg/dL — ABNORMAL HIGH (ref 0–99)
Triglycerides: 58 mg/dL (ref 0–149)
VLDL Cholesterol Cal: 12 mg/dL (ref 5–40)

## 2018-06-24 NOTE — Telephone Encounter (Signed)
Called pt. To discuss surgery on 06/30/18. PCP ask him to obtain Cardiac Clearance prior to surgery pt. stated he has an appt. With Dr. Juliann Pares on 06/25/18 and a follow up appt. with PCP on 06/29/18. PCP will let me know late evening on 06/29/18 if pt. Is cleared.

## 2018-06-25 DIAGNOSIS — Z01818 Encounter for other preprocedural examination: Secondary | ICD-10-CM | POA: Diagnosis not present

## 2018-06-25 DIAGNOSIS — I10 Essential (primary) hypertension: Secondary | ICD-10-CM | POA: Diagnosis not present

## 2018-06-25 DIAGNOSIS — F17200 Nicotine dependence, unspecified, uncomplicated: Secondary | ICD-10-CM | POA: Diagnosis not present

## 2018-06-25 DIAGNOSIS — R0602 Shortness of breath: Secondary | ICD-10-CM | POA: Diagnosis not present

## 2018-06-25 DIAGNOSIS — N210 Calculus in bladder: Secondary | ICD-10-CM | POA: Diagnosis not present

## 2018-06-25 DIAGNOSIS — R9431 Abnormal electrocardiogram [ECG] [EKG]: Secondary | ICD-10-CM | POA: Diagnosis not present

## 2018-06-29 ENCOUNTER — Ambulatory Visit (INDEPENDENT_AMBULATORY_CARE_PROVIDER_SITE_OTHER): Payer: PPO | Admitting: Physician Assistant

## 2018-06-29 ENCOUNTER — Encounter: Payer: Self-pay | Admitting: Physician Assistant

## 2018-06-29 VITALS — BP 162/84 | HR 78 | Temp 98.2°F | Resp 16 | Wt 179.0 lb

## 2018-06-29 DIAGNOSIS — I10 Essential (primary) hypertension: Secondary | ICD-10-CM | POA: Diagnosis not present

## 2018-06-29 DIAGNOSIS — E785 Hyperlipidemia, unspecified: Secondary | ICD-10-CM | POA: Diagnosis not present

## 2018-06-29 MED ORDER — CEFAZOLIN SODIUM-DEXTROSE 2-4 GM/100ML-% IV SOLN
2.0000 g | INTRAVENOUS | Status: AC
  Administered 2018-06-30: 2 g via INTRAVENOUS

## 2018-06-29 NOTE — Patient Instructions (Addendum)
Take 10 mg of amlodipine - this will mean you are taking TWO of the 5 mg amlodipine pills for blood pressure  Continue taking metorpolol 25 mg once daily for blood pressure We will start Lipitor 10 mg once a night before bed for cholesterol  Wait until after surgery to start new dosing.

## 2018-06-29 NOTE — Progress Notes (Signed)
Patient: Richard Lamb Male    DOB: 28-May-1950   68 y.o.   MRN: [id number] Visit Date: 06/30/2018  Today's Provider: Trey SailorsAdriana M Oneal Schoenberger, PA-C   Chief Complaint  Patient presents with  . Follow-up   Subjective:     HPI  Follow up for hypertension  The patient was last seen for this 1 weeks ago.  Changes made at last visit includ  Start amlodipine 5 mg and titrate up to 10 mg. Reported he has been taking 5 mg amlodipine because he saw Dr. Juliann Paresallwood in cardiology on 06/25/2018 and was instructed to stay at 5 mg amlodipine and also had 25 mg metoprolol succinate added. He reports he was cleared for surgery.    He reports excellent compliance with treatment. He feels that condition is Improved. He is not having side effects.   BP Readings from Last 3 Encounters:  06/30/18 (!) 172/92  06/29/18 (!) 162/84  06/23/18 (!) 170/100   HLD:   Lipid Panel     Component Value Date/Time   CHOL 216 (H) 06/23/2018 1119   TRIG 58 06/23/2018 1119   HDL 68 06/23/2018 1119   CHOLHDL 3.2 06/23/2018 1119   LDLCALC 136 (H) 06/23/2018 1119   The 10-year ASCVD risk score Denman George(Goff DC Jr., et al., 2013) is: 25%   Values used to calculate the score:     Age: 89 years     Sex: Male     Is Non-Hispanic African American: Yes     Diabetic: No     Tobacco smoker: Yes     Systolic Blood Pressure: 172 mmHg     Is BP treated: No     HDL Cholesterol: 68 mg/dL     Total Cholesterol: 216 mg/dL  ------------------------------------------------------------------------------------   Allergies  Allergen Reactions  . Sulfa Antibiotics Anaphylaxis    No current facility-administered medications for this visit.   Current Outpatient Medications:  .  atorvastatin (LIPITOR) 10 MG tablet, Take 1 tablet (10 mg total) by mouth daily., Disp: 90 tablet, Rfl: 0  Facility-Administered Medications Ordered in Other Visits:  .  albuterol (PROVENTIL HFA;VENTOLIN HFA) 108 (90 Base) MCG/ACT inhaler, , ,  Anesthesia Intra-op, Camille BalSandstrom, Tony O, CRNA, 5 puff at 06/30/18 [phone] .  dexamethasone (DECADRON) injection, , Intravenous, Anesthesia Intra-op, Camille BalSandstrom, Tony O, CRNA, 10 mg at 06/30/18 0933 .  ePHEDrine injection, , , Anesthesia Intra-op, Camille BalSandstrom, Tony O, CRNA, 10 mg at 06/30/18 0955 .  fentaNYL (SUBLIMAZE) injection, , , Anesthesia Intra-op, Camille BalSandstrom, Tony O, CRNA, 100 mcg at 06/30/18 [id number] .  lactated ringers infusion, , Intravenous, Continuous, Yevette EdwardsAdams, James G, MD, Last Rate: 20 mL/hr at 06/30/18 0754 .  lidocaine (cardiac) 100 mg/355mL (XYLOCAINE) injection 2%, , Intravenous, Anesthesia Intra-op, Camille BalSandstrom, Tony O, CRNA, 100 mg at 06/30/18 0916 .  phenylephrine (NEO-SYNEPHRINE) 100 mcg/mL in sodium chloride 0.9 % 100 mL infusion, , Intravenous, Continuous PRN, Camille BalSandstrom, Tony O, CRNA, Last Rate: 24 mL/hr at 06/30/18 0932, 40 mcg/min at 06/30/18 0932 .  phenylephrine (NEO-SYNEPHRINE) injection, , Intravenous, Anesthesia Intra-op, Camille BalSandstrom, Tony O, CRNA, 100 mcg at 06/30/18 0932 .  propofol (DIPRIVAN) 10 mg/mL bolus/IV push, , , Anesthesia Intra-op, Camille BalSandstrom, Tony O, CRNA, 50 mg at 06/30/18 1033 .  rocuronium (ZEMURON) injection, , , Anesthesia Intra-op, Camille BalSandstrom, Tony O, CRNA, 50 mg at 06/30/18 [id number]  Review of Systems  Constitutional: Negative.   Respiratory: Negative.   Cardiovascular: Negative.     Social History   Tobacco Use  .  Smoking status: Heavy Tobacco Smoker    Packs/day: 0.50    Types: Cigarettes  . Smokeless tobacco: Never Used  Substance Use Topics  . Alcohol use: Yes    Alcohol/week: 7.0 standard drinks    Types: 7 Cans of beer per week      Objective:   BP (!) 162/84 (BP Location: Left Arm, Patient Position: Sitting, Cuff Size: Normal)   Pulse 78   Temp 98.2 F (36.8 C) (Oral)   Resp 16   Wt 179 lb (81.2 kg)   SpO2 96%   BMI 23.62 kg/m  Vitals:   06/29/18 1537  BP: (!) 162/84  Pulse: 78  Resp: 16  Temp: 98.2 F (36.8 C)  TempSrc: Oral  SpO2: 96%   Weight: 179 lb (81.2 kg)     Physical Exam Constitutional:      Appearance: Normal appearance.  Cardiovascular:     Rate and Rhythm: Normal rate and regular rhythm.  Pulmonary:     Effort: Pulmonary effort is normal.     Breath sounds: Normal breath sounds.  Neurological:     General: No focal deficit present.     Mental Status: He is alert and oriented to person, place, and time.  Psychiatric:        Mood and Affect: Mood normal.        Behavior: Behavior normal.         Assessment & Plan    1. Essential hypertension  BP remains uncontrolled, but was better at 140/90 at cardiology visit. Instructed patient to increase to 10 mg amlodipine after surgery and continue taking metoprolol succinate 25 mg daily. He reports he has a visit with cardiology scheduled in march for stress testing. Follow up in this clinic in 2 weeks to check BP.  2. Hyperlipidemia, unspecified hyperlipidemia type  CVD risk elevated at 25%. Advised to start Lipitor 10 mg nightly after surgery completed.  Return in about 2 weeks (around 07/13/2018) for Blood pressure .  The entirety of the information documented in the History of Present Illness, Review of Systems and Physical Exam were personally obtained by me. Portions of this information were initially documented by Rondel Baton, CMA and reviewed by me for thoroughness and accuracy.        [name], PA-C  [facility]

## 2018-06-30 ENCOUNTER — Ambulatory Visit
Admission: RE | Admit: 2018-06-30 | Discharge: 2018-06-30 | Disposition: A | Discharge status: Home / Self Care | Payer: PPO | Attending: Urology | Admitting: Urology

## 2018-06-30 ENCOUNTER — Ambulatory Visit: Payer: PPO | Admitting: Anesthesiology

## 2018-06-30 ENCOUNTER — Other Ambulatory Visit: Payer: Self-pay

## 2018-06-30 ENCOUNTER — Encounter
Admission: RE | Disposition: A | Discharge status: Home / Self Care | Payer: Self-pay | Source: Home / Self Care | Attending: Urology

## 2018-06-30 DIAGNOSIS — F419 Anxiety disorder, unspecified: Secondary | ICD-10-CM | POA: Diagnosis not present

## 2018-06-30 DIAGNOSIS — K219 Gastro-esophageal reflux disease without esophagitis: Secondary | ICD-10-CM | POA: Diagnosis not present

## 2018-06-30 DIAGNOSIS — N210 Calculus in bladder: Secondary | ICD-10-CM | POA: Insufficient documentation

## 2018-06-30 DIAGNOSIS — I10 Essential (primary) hypertension: Secondary | ICD-10-CM | POA: Insufficient documentation

## 2018-06-30 DIAGNOSIS — F17210 Nicotine dependence, cigarettes, uncomplicated: Secondary | ICD-10-CM | POA: Diagnosis not present

## 2018-06-30 HISTORY — PX: CYSTOSCOPY WITH LITHOLAPAXY: SHX1425

## 2018-06-30 LAB — URINE DRUG SCREEN, QUALITATIVE (ARMC ONLY)
Amphetamines, Ur Screen: NOT DETECTED
BARBITURATES, UR SCREEN: NOT DETECTED
Benzodiazepine, Ur Scrn: NOT DETECTED
COCAINE METABOLITE, UR ~~LOC~~: NOT DETECTED
Cannabinoid 50 Ng, Ur ~~LOC~~: POSITIVE — AB
MDMA (ECSTASY) UR SCREEN: NOT DETECTED
Methadone Scn, Ur: NOT DETECTED
Opiate, Ur Screen: NOT DETECTED
Phencyclidine (PCP) Ur S: NOT DETECTED
TRICYCLIC, UR SCREEN: NOT DETECTED

## 2018-06-30 SURGERY — CYSTOSCOPY, WITH BLADDER CALCULUS LITHOLAPAXY
Anesthesia: General

## 2018-06-30 MED ORDER — DEXAMETHASONE SODIUM PHOSPHATE 10 MG/ML IJ SOLN
INTRAMUSCULAR | Status: DC | PRN
  Administered 2018-06-30: 10 mg via INTRAVENOUS

## 2018-06-30 MED ORDER — LACTATED RINGERS IV SOLN
INTRAVENOUS | Status: DC
  Administered 2018-06-30 (×3): via INTRAVENOUS

## 2018-06-30 MED ORDER — ROCURONIUM BROMIDE 100 MG/10ML IV SOLN
INTRAVENOUS | Status: DC | PRN
  Administered 2018-06-30: 50 mg via INTRAVENOUS

## 2018-06-30 MED ORDER — EPHEDRINE SULFATE 50 MG/ML IJ SOLN
INTRAMUSCULAR | Status: DC | PRN
  Administered 2018-06-30: 5 mg via INTRAVENOUS
  Administered 2018-06-30: 10 mg via INTRAVENOUS
  Administered 2018-06-30: 5 mg via INTRAVENOUS
  Administered 2018-06-30: 10 mg via INTRAVENOUS

## 2018-06-30 MED ORDER — PROPOFOL 10 MG/ML IV BOLUS
INTRAVENOUS | Status: AC
  Filled 2018-06-30: qty 20

## 2018-06-30 MED ORDER — ONDANSETRON HCL 4 MG/2ML IJ SOLN
INTRAMUSCULAR | Status: DC | PRN
  Administered 2018-06-30: 4 mg via INTRAVENOUS

## 2018-06-30 MED ORDER — FENTANYL CITRATE (PF) 100 MCG/2ML IJ SOLN
INTRAMUSCULAR | Status: AC
  Administered 2018-06-30: 25 ug via INTRAVENOUS
  Filled 2018-06-30: qty 2

## 2018-06-30 MED ORDER — FAMOTIDINE 20 MG PO TABS
20.0000 mg | ORAL_TABLET | Freq: Once | ORAL | Status: AC
  Administered 2018-06-30: 20 mg via ORAL

## 2018-06-30 MED ORDER — AMLODIPINE BESYLATE 10 MG PO TABS
10.0000 mg | ORAL_TABLET | Freq: Once | ORAL | Status: DC
  Filled 2018-06-30: qty 1

## 2018-06-30 MED ORDER — AMLODIPINE BESYLATE 5 MG PO TABS
5.0000 mg | ORAL_TABLET | Freq: Once | ORAL | Status: AC
  Administered 2018-06-30: 5 mg via ORAL
  Filled 2018-06-30: qty 1

## 2018-06-30 MED ORDER — FENTANYL CITRATE (PF) 100 MCG/2ML IJ SOLN
25.0000 ug | INTRAMUSCULAR | Status: DC | PRN
  Administered 2018-06-30: 25 ug via INTRAVENOUS

## 2018-06-30 MED ORDER — LIDOCAINE HCL (CARDIAC) PF 100 MG/5ML IV SOSY
PREFILLED_SYRINGE | INTRAVENOUS | Status: DC | PRN
  Administered 2018-06-30: 100 mg via INTRAVENOUS

## 2018-06-30 MED ORDER — FENTANYL CITRATE (PF) 100 MCG/2ML IJ SOLN
INTRAMUSCULAR | Status: AC
  Filled 2018-06-30: qty 2

## 2018-06-30 MED ORDER — ATORVASTATIN CALCIUM 10 MG PO TABS: 10.0000 mg | ORAL_TABLET | Freq: Every day | ORAL | 0 refills | Status: DC

## 2018-06-30 MED ORDER — SODIUM CHLORIDE 0.9 % IV SOLN
INTRAVENOUS | Status: DC | PRN
  Administered 2018-06-30: 40 ug/min via INTRAVENOUS

## 2018-06-30 MED ORDER — CEFAZOLIN SODIUM-DEXTROSE 2-4 GM/100ML-% IV SOLN
INTRAVENOUS | Status: AC
  Filled 2018-06-30: qty 100

## 2018-06-30 MED ORDER — PHENYLEPHRINE HCL 10 MG/ML IJ SOLN
INTRAMUSCULAR | Status: DC | PRN
  Administered 2018-06-30 (×2): 100 ug via INTRAVENOUS

## 2018-06-30 MED ORDER — FENTANYL CITRATE (PF) 100 MCG/2ML IJ SOLN
INTRAMUSCULAR | Status: DC | PRN
  Administered 2018-06-30: 100 ug via INTRAVENOUS

## 2018-06-30 MED ORDER — METOPROLOL TARTRATE 25 MG PO TABS
ORAL_TABLET | ORAL | Status: AC
  Administered 2018-06-30: 08:00:00
  Filled 2018-06-30: qty 1

## 2018-06-30 MED ORDER — FAMOTIDINE 20 MG PO TABS
ORAL_TABLET | ORAL | Status: AC
  Administered 2018-06-30: 20 mg via ORAL
  Filled 2018-06-30: qty 1

## 2018-06-30 MED ORDER — PROPOFOL 10 MG/ML IV BOLUS
INTRAVENOUS | Status: DC | PRN
  Administered 2018-06-30: 50 mg via INTRAVENOUS
  Administered 2018-06-30: 150 mg via INTRAVENOUS

## 2018-06-30 MED ORDER — ALBUTEROL SULFATE HFA 108 (90 BASE) MCG/ACT IN AERS
INHALATION_SPRAY | RESPIRATORY_TRACT | Status: DC | PRN
  Administered 2018-06-30 (×2): 5 via RESPIRATORY_TRACT

## 2018-06-30 MED ORDER — SUGAMMADEX SODIUM 200 MG/2ML IV SOLN
INTRAVENOUS | Status: DC | PRN
  Administered 2018-06-30: 100 mg via INTRAVENOUS

## 2018-06-30 SURGICAL SUPPLY — 19 items
ADAPTER IRRIG TUBE 2 SPIKE SOL (ADAPTER) ×3 IMPLANT
BAG DRAIN CYSTO-URO LG1000N (MISCELLANEOUS) ×3 IMPLANT
BAG URINE DRAINAGE (UROLOGICAL SUPPLIES) ×3 IMPLANT
BASKET ZERO TIP 1.9FR (BASKET) IMPLANT
CATH FOL 2WAY LX 16X30 (CATHETERS) ×3 IMPLANT
FIBER LASER 1000 (Laser) ×3 IMPLANT
GLOVE BIO SURGEON STRL SZ8 (GLOVE) ×3 IMPLANT
GOWN STRL REUS W/ TWL LRG LVL3 (GOWN DISPOSABLE) ×2 IMPLANT
GOWN STRL REUS W/ TWL XL LVL3 (GOWN DISPOSABLE) ×1 IMPLANT
GOWN STRL REUS W/TWL LRG LVL3 (GOWN DISPOSABLE) ×4
GOWN STRL REUS W/TWL XL LVL3 (GOWN DISPOSABLE) ×2
IV NS IRRIG 3000ML ARTHROMATIC (IV SOLUTION) ×18 IMPLANT
KIT TURNOVER CYSTO (KITS) ×3 IMPLANT
PACK CYSTO AR (MISCELLANEOUS) ×3 IMPLANT
SET IRRIG Y TYPE TUR BLADDER L (SET/KITS/TRAYS/PACK) ×3 IMPLANT
SURGILUBE 2OZ TUBE FLIPTOP (MISCELLANEOUS) ×3 IMPLANT
SYRINGE IRR TOOMEY STRL 70CC (SYRINGE) ×3 IMPLANT
WATER STERILE IRR 1000ML POUR (IV SOLUTION) ×3 IMPLANT
WATER STERILE IRR 3000ML UROMA (IV SOLUTION) ×3 IMPLANT

## 2018-06-30 NOTE — Progress Notes (Signed)
Foley draining pink tinged to pink urine no clots   Foley out with 300cc ofping tinged clear urine

## 2018-06-30 NOTE — Discharge Instructions (Signed)
  AMBULATORY SURGERY  DISCHARGE INSTRUCTIONS   1) The drugs that you were given will stay in your system until tomorrow so for the next 24 hours you should not:  A) Drive an automobile B) Make any legal decisions C) Drink any alcoholic beverage   2) You may resume regular meals tomorrow.  Today it is better to start with liquids and gradually work up to solid foods.  You may eat anything you prefer, but it is better to start with liquids, then soup and crackers, and gradually work up to solid foods.   3) Please notify your doctor immediately if you have any unusual bleeding, trouble breathing, redness and pain at the surgery site, drainage, fever, or pain not relieved by medication.    4) Additional Instructions: TAKE A STOOL SOFTENER TWICE A DAY WHILE TAKING NARCOTIC PAIN MEDICINE TO PREVENT CONSTIPATION   Please contact your physician with any problems or Same Day Surgery at [phone], Monday through Friday 6 am to 4 pm, or [place] [facility] [place] Main number at [phone].   

## 2018-06-30 NOTE — Progress Notes (Signed)
CH greeted and spoke briefly with pt family in the waiting area. Ch asked if they had any needs. Family declined but were thankful for ch presence.

## 2018-06-30 NOTE — Interval H&P Note (Signed)
History and Physical Interval Note:  06/30/2018 8:45 AM  Richard Lamb  has presented today for surgery, with the diagnosis of BLADDER STONE 12.6CM  The various methods of treatment have been discussed with the patient and family. After consideration of risks, benefits and other options for treatment, the patient has consented to  Procedure(s): CYSTOSCOPY WITH LITHOLAPAXY (N/A) as a surgical intervention .  The patient's history has been reviewed, patient examined, no change in status, stable for surgery.  I have reviewed the patient's chart and labs.  Questions were answered to the patient's satisfaction.     Destenee Guerry C Zoua Caporaso

## 2018-06-30 NOTE — Anesthesia Preprocedure Evaluation (Addendum)
Anesthesia Evaluation  Patient identified by MRN, date of birth, ID band Patient awake    Reviewed: Allergy & Precautions, H&P , NPO status , Patient's Chart, lab work & pertinent test results  Airway Mallampati: III  TM Distance: >3 FB     Dental  (+) Edentulous Upper, Edentulous Lower   Pulmonary Current Smoker,           Cardiovascular hypertension (poorly controlled, following with PCP, will give his amlodipine and metoprolol in preop holding), On Medications and On Home Beta Blockers      Neuro/Psych Anxiety negative neurological ROS     GI/Hepatic Neg liver ROS, GERD  Controlled,  Endo/Other  negative endocrine ROS  Renal/GU      Musculoskeletal   Abdominal   Peds  Hematology negative hematology ROS (+)   Anesthesia Other Findings Past Medical History: No date: Anxiety No date: GERD (gastroesophageal reflux disease) No date: Hypertension  Past Surgical History: No date: PROSTATE SURGERY     Comment:  Dr.Wolfe  BMI    Body Mass Index:  24.22 kg/m      Reproductive/Obstetrics negative OB ROS                          Anesthesia Physical Anesthesia Plan  ASA: III  Anesthesia Plan: General ETT   Post-op Pain Management:    Induction:   PONV Risk Score and Plan: Ondansetron, Dexamethasone, Midazolam and Treatment may vary due to age or medical condition  Airway Management Planned:   Additional Equipment:   Intra-op Plan:   Post-operative Plan:   Informed Consent: I have reviewed the patients History and Physical, chart, labs and discussed the procedure including the risks, benefits and alternatives for the proposed anesthesia with the patient or authorized representative who has indicated his/her understanding and acceptance.     Dental Advisory Given  Plan Discussed with: Anesthesiologist and CRNA  Anesthesia Plan Comments:         Anesthesia Quick  Evaluation

## 2018-06-30 NOTE — Anesthesia Post-op Follow-up Note (Signed)
Anesthesia QCDR form completed.        

## 2018-06-30 NOTE — Progress Notes (Signed)
   06/30/18 0700  Clinical Encounter Type  Visited With Family  Visit Type Social support  Spiritual Encounters  Spiritual Needs Emotional  Stress Factors  Patient Stress Factors Health changes  Family Stress Factors Exhausted   Ch visited briefly w/ pt family in waiting area while pt was in pre-op. Ch asked if there were any needs at the moment, Pt family declined but were thankful of ch presence.

## 2018-06-30 NOTE — Anesthesia Procedure Notes (Signed)
Procedure Name: Intubation Performed by: Marcy Siren, CRNA Pre-anesthesia Checklist: Patient identified, Emergency Drugs available, Suction available, Patient being monitored and Timeout performed Patient Re-evaluated:Patient Re-evaluated prior to induction Oxygen Delivery Method: Circle system utilized Preoxygenation: Pre-oxygenation with 100% oxygen Induction Type: IV induction Ventilation: Mask ventilation without difficulty Laryngoscope Size: Mac and 3 Grade View: Grade I Tube type: Oral Tube size: 7.0 mm Number of attempts: 1 Placement Confirmation: ETT inserted through vocal cords under direct vision Secured at: 23 cm Tube secured with: Tape Dental Injury: Teeth and Oropharynx as per pre-operative assessment

## 2018-06-30 NOTE — Transfer of Care (Signed)
Immediate Anesthesia Transfer of Care Note  Patient: Richard Lamb  Procedure(s) Performed: CYSTOSCOPY WITH LITHOLAPAXY (N/A )  Patient Location: PACU  Anesthesia Type:General  Level of Consciousness: awake  Airway & Oxygen Therapy: Patient Spontanous Breathing  Post-op Assessment: Report given to RN  Post vital signs: stable  Last Vitals:  Vitals Value Taken Time  BP    Temp    Pulse 66 06/30/2018 11:11 AM  Resp    SpO2 98 % 06/30/2018 11:11 AM  Vitals shown include unvalidated device data.  Last Pain:  Vitals:   06/30/18 0743  TempSrc: Oral  PainSc: 0-No pain         Complications: No apparent anesthesia complications

## 2018-06-30 NOTE — Op Note (Signed)
Preoperative diagnosis:  1. Bladder calculus 2.6 cm  Postoperative diagnosis:  1. Bladder calculus 2.6 cm  Procedure: 1. Cystolitholapaxy  Surgeon: Riki Altes, MD  Anesthesia: General  Complications: None  Intraoperative findings:  1.  Urethra with wide caliber bulb stricture; prostate with adenoma regrowth.  Bladder calculus identified at the bladder neck.  No mucosal erythema, solid or papillary lesions.  EBL: Minimal  Specimens: None  Indication: Richard Lamb is a 68 y.o. patient with 26 mm bladder calculus who presents for cystolitholapaxy..  After reviewing the management options for treatment, he elected to proceed with the above surgical procedure(s). We have discussed the potential benefits and risks of the procedure, side effects of the proposed treatment, the likelihood of the patient achieving the goals of the procedure, and any potential problems that might occur during the procedure or recuperation. Informed consent has been obtained.  Description of procedure:  The patient was taken to the operating room and general anesthesia was induced.  The patient was placed in the dorsal lithotomy position, prepped and draped in the usual sterile fashion, and preoperative antibiotics were administered. A preoperative time-out was performed.   A 26 French continuous-flow resectoscope sheath with visual obturator was lubricated and passed under direct vision without difficulty.  The obturator was removed and replaced with a laser bridge.  A 1000 m holmium laser fiber was placed through the scope and the stone was dusted at initial setting of 1 J / 10 Hz which was increased to 1 J / 20 Hz.  After complete dusting of the stone small fragments were removed via irrigation.  At the completion of procedure no fragments were defined in the bladder.  There was no significant bleeding noted.  A 16 French Foley catheter was placed with return of light pink effluent on  irrigation.  After anesthetic reversal patient was transported to the PACU in stable condition.  Plan: The catheter will be removed prior to discharge if no significant hematuria.   Riki Altes, M.D.

## 2018-07-01 NOTE — Anesthesia Postprocedure Evaluation (Signed)
Anesthesia Post Note  Patient: Richard Lamb  Procedure(s) Performed: CYSTOSCOPY WITH LITHOLAPAXY (N/A )  Patient location during evaluation: PACU Anesthesia Type: General Level of consciousness: awake and alert Pain management: pain level controlled Vital Signs Assessment: post-procedure vital signs reviewed and stable Respiratory status: spontaneous breathing, nonlabored ventilation and respiratory function stable Cardiovascular status: blood pressure returned to baseline and stable Postop Assessment: no apparent nausea or vomiting Anesthetic complications: no     Last Vitals:  Vitals:   06/30/18 1212 06/30/18 1249  BP: (!) 141/82 (!) 161/79  Pulse:  (!) 55  Resp: 16 16  Temp: (!) 36 C   SpO2: 97% 98%    Last Pain:  Vitals:   06/30/18 1249  TempSrc:   PainSc: 0-No pain                 Jovita Gamma

## 2018-07-14 ENCOUNTER — Ambulatory Visit (INDEPENDENT_AMBULATORY_CARE_PROVIDER_SITE_OTHER): Payer: PPO | Admitting: Physician Assistant

## 2018-07-14 ENCOUNTER — Encounter: Payer: Self-pay | Admitting: Physician Assistant

## 2018-07-14 VITALS — BP 162/78 | HR 71 | Temp 98.8°F | Wt 181.6 lb

## 2018-07-14 DIAGNOSIS — I10 Essential (primary) hypertension: Secondary | ICD-10-CM

## 2018-07-14 DIAGNOSIS — E785 Hyperlipidemia, unspecified: Secondary | ICD-10-CM

## 2018-07-14 NOTE — Patient Instructions (Signed)

## 2018-07-14 NOTE — Progress Notes (Signed)
Patient: Richard Lamb Male    DOB: Feb 13, 1951   68 y.o.   MRN: [id number] Visit Date: 07/14/2018  Today's Provider: [name], PA-C   Chief Complaint  Patient presents with  . Hypertension   Subjective:    HPI  Hypertension, follow-up:  BP Readings from Last 3 Encounters:  07/14/18 (!) 162/78  06/30/18 (!) 161/79  06/29/18 (!) 162/84    He was last seen for hypertension 2 weeks ago.  BP at that visit was 162/84. Management changes since that visit include: increase to 10 mg amlodipine after surgery and continue taking metoprolol succinate 25 mg daily. Patient is not doing that, he is only taking one 5 mg amlodipine tablet daily.  He reports good compliance with treatment. He is not having side effects.  He is exercising. He is not adherent to low salt diet.   Outside blood pressures are n/a. He is experiencing none.  Patient denies chest pain, chest pressure/discomfort, fatigue, irregular heart beat, lower extremity edema, palpitations and tachypnea.   Cardiovascular risk factors include advanced age (older than 16 for men, 41 for women) and hypertension.  Use of agents associated with hypertension: none.     Weight trend: fluctuating a bit Wt Readings from Last 3 Encounters:  07/14/18 181 lb 9.6 oz (82.4 kg)  06/30/18 178 lb 9.6 oz (81 kg)  06/29/18 179 lb (81.2 kg)    Current diet: well balanced  ------------------------------------------------------------------------   Allergies  Allergen Reactions  . Sulfa Antibiotics Anaphylaxis     Current Outpatient Medications:  .  amLODipine (NORVASC) 5 MG tablet, Take 5 mg daily for three days. Then on day 4, take 10 mg or two pills daily., Disp: 90 tablet, Rfl: 0 .  atorvastatin (LIPITOR) 10 MG tablet, Take 1 tablet (10 mg total) by mouth daily., Disp: 90 tablet, Rfl: 0 .  metoprolol succinate (TOPROL-XL) 25 MG 24 hr tablet, Take by mouth., Disp: , Rfl:   Review of Systems  HENT: Negative.    Respiratory: Negative.   Genitourinary: Negative.   Neurological: Negative.     Social History   Tobacco Use  . Smoking status: Heavy Tobacco Smoker    Packs/day: 0.50    Types: Cigarettes  . Smokeless tobacco: Never Used  Substance Use Topics  . Alcohol use: Yes    Alcohol/week: 7.0 standard drinks    Types: 7 Cans of beer per week      Objective:   BP (!) 162/78 (BP Location: Left Arm, Patient Position: Sitting, Cuff Size: Normal)   Pulse 71   Temp 98.8 F (37.1 C) (Oral)   Wt 181 lb 9.6 oz (82.4 kg)   SpO2 96%   BMI 24.63 kg/m  Vitals:   07/14/18 0839  BP: (!) 162/78  Pulse: 71  Temp: 98.8 F (37.1 C)  TempSrc: Oral  SpO2: 96%  Weight: 181 lb 9.6 oz (82.4 kg)     Physical Exam Constitutional:      Appearance: Normal appearance.  Cardiovascular:     Rate and Rhythm: Normal rate and regular rhythm.     Heart sounds: Normal heart sounds.  Pulmonary:     Effort: Pulmonary effort is normal.     Breath sounds: Normal breath sounds.  Skin:    General: Skin is warm and dry.  Neurological:     Mental Status: He is alert and oriented to person, place, and time. Mental status is at baseline.  Psychiatric:  Mood and Affect: Mood normal.        Behavior: Behavior normal.         Assessment & Plan    1. Essential hypertension  Blood pressure largely the same as patient did not increase amlodipine to 10 mg or two 5 mg amlodipine. I have gone over in depth how to do this again. I offered to send him in amlodipine 10 mg daily but he wants to use 5 mg amlodipine.   2. Hyperlipidemia, unspecified hyperlipidemia type  Taking lipitor 10 mg nightly. Follow up 2 months for CPE and f/u.  Return in about 2 months (around 09/12/2018) for cpe and follow up .  The entirety of the information documented in the History of Present Illness, Review of Systems and Physical Exam were personally obtained by me. Portions of this information were initially documented by  Rondel Baton, CMA and reviewed by me for thoroughness and accuracy.         [name], PA-C  [facility]

## 2018-07-28 DIAGNOSIS — R9431 Abnormal electrocardiogram [ECG] [EKG]: Secondary | ICD-10-CM | POA: Diagnosis not present

## 2018-07-28 DIAGNOSIS — R0602 Shortness of breath: Secondary | ICD-10-CM | POA: Diagnosis not present

## 2018-08-04 ENCOUNTER — Other Ambulatory Visit: Payer: Self-pay | Admitting: Physician Assistant

## 2018-08-04 DIAGNOSIS — R0602 Shortness of breath: Secondary | ICD-10-CM | POA: Diagnosis not present

## 2018-08-04 DIAGNOSIS — Z01818 Encounter for other preprocedural examination: Secondary | ICD-10-CM | POA: Diagnosis not present

## 2018-08-04 DIAGNOSIS — R9431 Abnormal electrocardiogram [ECG] [EKG]: Secondary | ICD-10-CM | POA: Diagnosis not present

## 2018-08-04 DIAGNOSIS — I10 Essential (primary) hypertension: Secondary | ICD-10-CM

## 2018-08-04 DIAGNOSIS — F17200 Nicotine dependence, unspecified, uncomplicated: Secondary | ICD-10-CM | POA: Diagnosis not present

## 2018-08-04 DIAGNOSIS — N210 Calculus in bladder: Secondary | ICD-10-CM | POA: Diagnosis not present

## 2018-08-04 NOTE — Telephone Encounter (Signed)
Please advise 

## 2018-08-05 MED ORDER — AMLODIPINE BESYLATE 10 MG PO TABS: 10.0000 mg | ORAL_TABLET | Freq: Every day | ORAL | 0 refills | Status: DC

## 2018-09-08 ENCOUNTER — Encounter: Payer: Self-pay | Admitting: Physician Assistant

## 2018-09-24 ENCOUNTER — Other Ambulatory Visit: Payer: Self-pay | Admitting: Physician Assistant

## 2018-09-24 DIAGNOSIS — E785 Hyperlipidemia, unspecified: Secondary | ICD-10-CM

## 2018-11-02 ENCOUNTER — Other Ambulatory Visit: Payer: Self-pay | Admitting: Physician Assistant

## 2018-11-02 DIAGNOSIS — I10 Essential (primary) hypertension: Secondary | ICD-10-CM

## 2018-11-17 ENCOUNTER — Encounter: Payer: Self-pay | Admitting: Physician Assistant

## 2018-12-23 ENCOUNTER — Other Ambulatory Visit: Payer: Self-pay | Admitting: Physician Assistant

## 2018-12-23 DIAGNOSIS — E785 Hyperlipidemia, unspecified: Secondary | ICD-10-CM

## 2018-12-28 ENCOUNTER — Encounter: Payer: Self-pay | Admitting: Physician Assistant

## 2018-12-28 ENCOUNTER — Ambulatory Visit (INDEPENDENT_AMBULATORY_CARE_PROVIDER_SITE_OTHER): Payer: PPO | Admitting: Physician Assistant

## 2018-12-28 ENCOUNTER — Other Ambulatory Visit: Payer: Self-pay

## 2018-12-28 VITALS — BP 185/75 | HR 59 | Temp 97.9°F | Resp 16 | Ht 73.0 in | Wt 183.0 lb

## 2018-12-28 DIAGNOSIS — Z1211 Encounter for screening for malignant neoplasm of colon: Secondary | ICD-10-CM

## 2018-12-28 DIAGNOSIS — Z23 Encounter for immunization: Secondary | ICD-10-CM | POA: Diagnosis not present

## 2018-12-28 DIAGNOSIS — Z0000 Encounter for general adult medical examination without abnormal findings: Secondary | ICD-10-CM

## 2018-12-28 NOTE — Progress Notes (Signed)
Patient: Richard Lamb, Male    DOB: 1950/11/04, 68 y.o.   MRN: [id number] Visit Date: 01/12/2019  Today's Provider: Trey SailorsAdriana M Pollak, PA-C   Chief Complaint  Patient presents with  . Medicare Wellness   Subjective:    Initial preventative physical exam Richard Lamb is a 68 y.o. male who presents today for his Initial Preventative Physical Exam. He feels well. He reports exercising yes. He reports he is sleeping poorly.  Colon Cancer Screening: Refuses colon cancer screening to include colonoscopy, cologuard and hemoccult card.  Smoking: half a pack a day, started 10-15 years ago Aneurysm screening: declined Lung cancer screening: does not meet criteria.  Prevnar: due today  BP Readings from Last 3 Encounters:  12/28/18 (!) 185/75  07/14/18 (!) 162/78  06/30/18 (!) 161/79   BP very elevated today. Patient says it    Review of Systems  All other systems reviewed and are negative.   Social History   Socioeconomic History  . Marital status: Single    Spouse name: Not on file  . Number of children: Not on file  . Years of education: Not on file  . Highest education level: Not on file  Occupational History  . Not on file  Social Needs  . Financial resource strain: Not on file  . Food insecurity    Worry: Not on file    Inability: Not on file  . Transportation needs    Medical: Not on file    Non-medical: Not on file  Tobacco Use  . Smoking status: Heavy Tobacco Smoker    Packs/day: 0.50    Types: Cigarettes  . Smokeless tobacco: Never Used  Substance and Sexual Activity  . Alcohol use: Yes    Alcohol/week: 7.0 standard drinks    Types: 7 Cans of beer per week  . Drug use: Yes    Types: Marijuana  . Sexual activity: Yes    Birth control/protection: None  Lifestyle  . Physical activity    Days per week: Not on file    Minutes per session: Not on file  . Stress: Not on file  Relationships  . Social Musicianconnections    Talks on phone: Not  on file    Gets together: Not on file    Attends religious service: Not on file    Active member of club or organization: Not on file    Attends meetings of clubs or organizations: Not on file    Relationship status: Not on file  . Intimate partner violence    Fear of current or ex partner: Not on file    Emotionally abused: Not on file    Physically abused: Not on file    Forced sexual activity: Not on file  Other Topics Concern  . Not on file  Social History Narrative  . Not on file    Past Medical History:  Diagnosis Date  . Anxiety   . GERD (gastroesophageal reflux disease)   . Hypertension      Patient Active Problem List   Diagnosis Date Noted  . Essential hypertension 06/29/2018  . Calculus of bladder 06/03/2018  . Lower urinary tract symptoms 06/03/2018    Past Surgical History:  Procedure Laterality Date  . CYSTOSCOPY WITH LITHOLAPAXY N/A 06/30/2018   Procedure: CYSTOSCOPY WITH LITHOLAPAXY;  Surgeon: Riki AltesStoioff, Scott C, MD;  Location: [facility] ORS;  Service: Urology;  Laterality: N/A;  . PROSTATE SURGERY     Dr.Wolfe  His family history is not on file.   Current Outpatient Medications:  .  amLODipine (NORVASC) 10 MG tablet, TAKE 1 TABLET BY MOUTH EVERY DAY, Disp: 90 tablet, Rfl: 0 .  atorvastatin (LIPITOR) 10 MG tablet, TAKE 1 TABLET BY MOUTH EVERY DAY, Disp: 90 tablet, Rfl: 0 .  metoprolol succinate (TOPROL-XL) 25 MG 24 hr tablet, Take by mouth., Disp: , Rfl:    Patient Care Team: Paulene Floor as PCP - General (Physician Assistant)   Objective:    Vitals: BP (!) 185/75 (BP Location: Left Arm, Cuff Size: Large)   Pulse (!) 59   Temp 97.9 F (36.6 C) (Oral)   Resp 16   Ht 6\' 1"  (1.854 m)   Wt 183 lb (83 kg)   SpO2 97%   BMI 24.14 kg/m   Physical Exam Constitutional:      Appearance: Normal appearance.  Cardiovascular:     Rate and Rhythm: Normal rate and regular rhythm.     Heart sounds: Normal heart sounds.  Pulmonary:     Effort:  Pulmonary effort is normal.     Breath sounds: Normal breath sounds.  Abdominal:     General: Abdomen is flat. Bowel sounds are normal.     Palpations: Abdomen is soft.  Skin:    General: Skin is warm and dry.  Neurological:     Mental Status: He is alert and oriented to person, place, and time. Mental status is at baseline.  Psychiatric:        Mood and Affect: Mood normal.        Behavior: Behavior normal.      No exam data present  Activities of Daily Living In your present state of health, do you have any difficulty performing the following activities: 12/28/2018 06/30/2018  Hearing? N N  Vision? N N  Comment - -  Difficulty concentrating or making decisions? N N  Walking or climbing stairs? N N  Dressing or bathing? N N  Doing errands, shopping? N -  Some recent data might be hidden    Fall Risk Assessment Fall Risk  06/23/2018  Falls in the past year? 0  Number falls in past yr: 0  Injury with Fall? 0     Depression Screen PHQ 2/9 Scores 12/28/2018 06/23/2018  PHQ - 2 Score 0 0  PHQ- 9 Score 0 0    No flowsheet data found.    Assessment & Plan:     Initial Preventative Physical Exam  Reviewed patient's Family Medical History Reviewed and updated list of patient's medical providers Assessment of cognitive impairment was done Assessed patient's functional ability Established a written schedule for health screening [place] Completed and Reviewed  Exercise Activities and Dietary recommendations Goals   None     Immunization History  Administered Date(s) Administered  . Influenza,inj,Quad PF,6+ Mos 04/07/2018  . Pneumococcal Conjugate-13 12/28/2018    Health Maintenance  Topic Date Due  . Hepatitis C Screening  August 12, 1950  . HIV Screening  04/19/1968  . TETANUS/TDAP  04/19/1972  . INFLUENZA VACCINE  12/26/2018  . COLONOSCOPY  12/28/2019 (Originally 04/20/2003)  . PNA vac Low Risk Adult (2 of 2 - PPSV23) 12/28/2019      Discussed health benefits of physical activity, and encouraged him to engage in regular exercise appropriate for his age and condition.    1. Welcome to Medicare preventive visit  Refuses preventive screening. EKGs comparable to priors from cardiology and he is followed by cardiology.  He will need to come back to check his BP.   - Pneumococcal conjugate vaccine 13-valent - EKG 12-Lead  2. Colon cancer screening  Counseled that colon cancer screening is very important and benefits/risks regarding refusing this screening.   The entirety of the information documented in the History of Present Illness, Review of Systems and Physical Exam were personally obtained by me. Portions of this information were initially documented by April M. Hyacinth MeekerMiller, CMA and reviewed by me for thoroughness and accuracy.    ------------------------------------------------------------------------------------------------------------    Trey SailorsAdriana M Pollak, PA-C  [facility]

## 2018-12-28 NOTE — Patient Instructions (Addendum)
Call insurance company about shingles shot  [facility] in [place] - call about viagra or cialis Call different pharmacies and check the price  Health Maintenance After Age 66 After age 61, you are at a higher risk for certain long-term diseases and infections as well as injuries from falls. Falls are a major cause of broken bones and head injuries in people who are older than age 44. Getting regular preventive care can help to keep you healthy and well. Preventive care includes getting regular testing and making lifestyle changes as recommended by your health care provider. Talk with your health care provider about:  Which screenings and tests you should have. A screening is a test that checks for a disease when you have no symptoms.  A diet and exercise plan that is right for you. What should I know about screenings and tests to prevent falls? Screening and testing are the best ways to find a health problem early. Early diagnosis and treatment give you the best chance of managing medical conditions that are common after age 49. Certain conditions and lifestyle choices may make you more likely to have a fall. Your health care provider may recommend:  Regular vision checks. Poor vision and conditions such as cataracts can make you more likely to have a fall. If you wear glasses, make sure to get your prescription updated if your vision changes.  Medicine review. Work with your health care provider to regularly review all of the medicines you are taking, including over-the-counter medicines. Ask your health care provider about any side effects that may make you more likely to have a fall. Tell your health care provider if any medicines that you take make you feel dizzy or sleepy.  Osteoporosis screening. Osteoporosis is a condition that causes the bones to get weaker. This can make the bones weak and cause them to break more easily.  Blood pressure screening. Blood pressure changes and  medicines to control blood pressure can make you feel dizzy.  Strength and balance checks. Your health care provider may recommend certain tests to check your strength and balance while standing, walking, or changing positions.  Foot health exam. Foot pain and numbness, as well as not wearing proper footwear, can make you more likely to have a fall.  Depression screening. You may be more likely to have a fall if you have a fear of falling, feel emotionally low, or feel unable to do activities that you used to do.  Alcohol use screening. Using too much alcohol can affect your balance and may make you more likely to have a fall. What actions can I take to lower my risk of falls? General instructions  Talk with your health care provider about your risks for falling. Tell your health care provider if: ? You fall. Be sure to tell your health care provider about all falls, even ones that seem minor. ? You feel dizzy, sleepy, or off-balance.  Take over-the-counter and prescription medicines only as told by your health care provider. These include any supplements.  Eat a healthy diet and maintain a healthy weight. A healthy diet includes low-fat dairy products, low-fat (lean) meats, and fiber from whole grains, beans, and lots of fruits and vegetables. Home safety  Remove any tripping hazards, such as rugs, cords, and clutter.  Install safety equipment such as grab bars in bathrooms and safety rails on stairs.  Keep rooms and walkways well-lit. Activity   Follow a regular exercise program to stay fit. This  will help you maintain your balance. Ask your health care provider what types of exercise are appropriate for you.  If you need a cane or walker, use it as recommended by your health care provider.  Wear supportive shoes that have nonskid soles. Lifestyle  Do not drink alcohol if your health care provider tells you not to drink.  If you drink alcohol, limit how much you have: ? 0-1  drink a day for women. ? 0-2 drinks a day for men.  Be aware of how much alcohol is in your drink. In the [place] one drink equals one typical bottle of beer (12 oz), one-half glass of wine (5 oz), or one shot of hard liquor (1 oz).  Do not use any products that contain nicotine or tobacco, such as cigarettes and e-cigarettes. If you need help quitting, ask your health care provider. Summary  Having a healthy lifestyle and getting preventive care can help to protect your health and wellness after age 27.  Screening and testing are the best way to find a health problem early and help you avoid having a fall. Early diagnosis and treatment give you the best chance for managing medical conditions that are more common for people who are older than age 69.  Falls are a major cause of broken bones and head injuries in people who are older than age 9. Take precautions to prevent a fall at home.  Work with your health care provider to learn what changes you can make to improve your health and wellness and to prevent falls. This information is not intended to replace advice given to you by your health care provider. Make sure you discuss any questions you have with your health care provider. Document Released: 03/26/2017 Document Revised: 09/03/2018 Document Reviewed: 03/26/2017 Elsevier Patient Education  2020 [employer].

## 2019-01-31 ENCOUNTER — Other Ambulatory Visit: Payer: Self-pay | Admitting: Physician Assistant

## 2019-01-31 DIAGNOSIS — I10 Essential (primary) hypertension: Secondary | ICD-10-CM

## 2019-02-02 NOTE — Telephone Encounter (Signed)
L.O.V. was 12/28/2018. 

## 2019-03-30 ENCOUNTER — Encounter: Payer: Self-pay | Admitting: Physician Assistant

## 2019-03-30 ENCOUNTER — Other Ambulatory Visit: Payer: Self-pay

## 2019-03-30 ENCOUNTER — Ambulatory Visit (INDEPENDENT_AMBULATORY_CARE_PROVIDER_SITE_OTHER): Payer: PPO | Admitting: Physician Assistant

## 2019-03-30 VITALS — BP 152/99 | HR 78 | Temp 96.9°F | Resp 16 | Wt 179.0 lb

## 2019-03-30 DIAGNOSIS — I10 Essential (primary) hypertension: Secondary | ICD-10-CM | POA: Diagnosis not present

## 2019-03-30 DIAGNOSIS — Z23 Encounter for immunization: Secondary | ICD-10-CM | POA: Diagnosis not present

## 2019-03-30 MED ORDER — HYDROCHLOROTHIAZIDE 25 MG PO TABS: 25.0000 mg | ORAL_TABLET | Freq: Every day | ORAL | 0 refills | Status: DC

## 2019-03-30 MED ORDER — LISINOPRIL-HYDROCHLOROTHIAZIDE 10-12.5 MG PO TABS: 1.0000 | ORAL_TABLET | Freq: Every day | ORAL | 0 refills | Status: DC

## 2019-03-30 NOTE — Patient Instructions (Signed)

## 2019-03-30 NOTE — Progress Notes (Signed)
Patient: Richard Lamb Male    DOB: 02-May-1951   68 y.o.   MRN: [id number] Visit Date: 03/30/2019  Today's Provider: Trinna Post, PA-C   Chief Complaint  Patient presents with  . Hypertension  . Hyperlipidemia   Subjective:     HPI   His father passed away last week of brain cancer.    Hypertension, follow-up:  BP Readings from Last 3 Encounters:  03/30/19 (!) 152/99  12/28/18 (!) 185/75  07/14/18 (!) 162/78    He was last seen for hypertension 6 months ago.  BP at that visit was 162/78.  He reports excellent compliance with treatment. He is taking amlodipine 10 mg daily and metoprolol succinate 25 mg daily.  He is not having side effects.  He is exercising. He is not adherent to low salt diet.   Outside blood pressures are not being checked. He is experiencing none.  Patient denies chest pain, chest pressure/discomfort, claudication, dyspnea, exertional chest pressure/discomfort, fatigue, irregular heart beat, lower extremity edema, near-syncope, orthopnea, palpitations, paroxysmal nocturnal dyspnea, syncope and tachypnea.   Cardiovascular risk factors include advanced age (older than 16 for men, 104 for women), hypertension, male gender and smoking/ tobacco exposure.  Use of agents associated with hypertension: none.     Weight trend: stable Wt Readings from Last 3 Encounters:  03/30/19 179 lb (81.2 kg)  12/28/18 183 lb (83 kg)  07/14/18 181 lb 9.6 oz (82.4 kg)    Current diet: in general, a "healthy" diet    ------------------------------------------------------------------------   Lipid/Cholesterol, Follow-up:   Last seen for this6 months ago.  Management changes since that visit include none. . Last Lipid Panel:    Component Value Date/Time   CHOL 216 (H) 06/23/2018 1119   TRIG 58 06/23/2018 1119   HDL 68 06/23/2018 1119   CHOLHDL 3.2 06/23/2018 1119   LDLCALC 136 (H) 06/23/2018 1119    Risk factors for vascular disease  include hypercholesterolemia and hypertension  He reports fair compliance with treatment. He is not having side effects.  Current symptoms include none and have been unchanged. 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   Wt Readings from Last 3 Encounters:  03/30/19 179 lb (81.2 kg)  12/28/18 183 lb (83 kg)  07/14/18 181 lb 9.6 oz (82.4 kg)    -------------------------------------------------------------------  Allergies  Allergen Reactions  . Sulfa Antibiotics Anaphylaxis     Current Outpatient Medications:  .  amLODipine (NORVASC) 10 MG tablet, TAKE 1 TABLET BY MOUTH EVERY DAY, Disp: 90 tablet, Rfl: 0 .  atorvastatin (LIPITOR) 10 MG tablet, TAKE 1 TABLET BY MOUTH EVERY DAY, Disp: 90 tablet, Rfl: 0 .  metoprolol succinate (TOPROL-XL) 25 MG 24 hr tablet, Take by mouth., Disp: , Rfl:   Review of Systems  Social History   Tobacco Use  . Smoking status: Heavy Tobacco Smoker    Packs/day: 0.50    Types: Cigarettes  . Smokeless tobacco: Never Used  Substance Use Topics  . Alcohol use: Yes    Alcohol/week: 7.0 standard drinks    Types: 7 Cans of beer per week      Objective:   BP (!) 152/99   Pulse 78   Temp (!) 96.9 F (36.1 C) (Oral)   Resp 16   Wt 179 lb (81.2 kg)  BMI 23.62 kg/m  Vitals:   03/30/19 0826 03/30/19 0858  BP: (!) 163/96 (!) 152/99  Pulse: 78   Resp: 16   Temp: (!) 96.9 F (36.1 C)   TempSrc: Oral   Weight: 179 lb (81.2 kg)   Body mass index is 23.62 kg/m.   Physical Exam Constitutional:      Appearance: Normal appearance. He is normal weight.  Cardiovascular:     Rate and Rhythm: Normal rate and regular rhythm.     Heart sounds: Normal heart sounds.  Pulmonary:     Effort: Pulmonary effort is normal.     Breath sounds: Normal breath sounds.  Skin:    General: Skin is  warm and dry.  Neurological:     Mental Status: He is alert and oriented to person, place, and time. Mental status is at baseline.  Psychiatric:        Mood and Affect: Mood normal.        Behavior: Behavior normal.      No results found for any visits on 03/30/19.     Assessment & Plan    1. Essential hypertension  BP is lower though remains elevated on recheck. Not checked at home. Called pharmacy to confirm which Rx patient is picking up and he is picking up 10 mg amlodipine and metoprolol succinate 25 mg daily. I would like to have him on Lisinopril-HCTZ but he does not want to take lisinopril due to risk of angioedema. I will send in HCTZ as below and f/u 1 month for OV and labs. Have counseled him as always to stop smoking, which he is currently doing at 1/2 pack per day.   - hydrochlorothiazide (HYDRODIURIL) 25 MG tablet; Take 1 tablet (25 mg total) by mouth daily.  Dispense: 90 tablet; Refill: 0  2. Need for influenza vaccination  - Flu Vaccine QUAD High Dose(Fluad)  The entirety of the information documented in the History of Present Illness, Review of Systems and Physical Exam were personally obtained by me. Portions of this information were initially documented by Sheliah Hatch, CMA and reviewed by me for thoroughness and accuracy.         [name], PA-C  [facility]

## 2019-04-05 ENCOUNTER — Telehealth: Payer: Self-pay | Admitting: Physician Assistant

## 2019-04-05 NOTE — Telephone Encounter (Signed)
Patient was advised of the 3 medication in his chart for bp. Patient states that the pharmacy gave him the Lisinopril-HCTZ but was advised to not take the medication. FYI

## 2019-04-05 NOTE — Telephone Encounter (Signed)
Yes he did not want to take that one, though there is no reason he can't. He should make sure he picked up the HCTZ alone and start taking that.

## 2019-04-05 NOTE — Telephone Encounter (Signed)
Pt needing to know which BP medications he is supposed to take. He now has 4 to take.  He's not sure if he is to take all of them.  Please call pt back at [phone] to advise.  Thanks, [employer]

## 2019-04-05 NOTE — Telephone Encounter (Signed)
HCTZ was picked up as well.

## 2019-04-27 ENCOUNTER — Other Ambulatory Visit: Payer: Self-pay | Admitting: Physician Assistant

## 2019-04-27 DIAGNOSIS — I10 Essential (primary) hypertension: Secondary | ICD-10-CM

## 2019-05-03 NOTE — Progress Notes (Signed)
Patient: Richard Lamb Male    DOB: Jun 11, 1950   68 y.o.   MRN: [id number] Visit Date: 05/04/2019  Today's Provider: [name], PA-C   Chief Complaint  Patient presents with  . Hypertension   Subjective:     HPI  Hypertension, follow-up:  BP Readings from Last 3 Encounters:  05/04/19 (!) 154/94  03/30/19 (!) 152/99  12/28/18 (!) 185/75    He was last seen for hypertension 1 months ago.  BP at that visit was 152/99. Management changes since that visit include started on HTCZ 25 MG. Taking amlodipine 10 mg daily and metorpolol succinate 25 mg daily  He reports good compliance with treatment. He is not having side effects.  He is exercising. He is adherent to low salt diet.   Outside blood pressures are not checked. He is experiencing none.  Cardiovascular risk factors include advanced age (older than 13 for men, 22 for women), hypertension, male gender and smoking/ tobacco exposure.  Use of agents associated with hypertension: none.     Weight trend: stable Wt Readings from Last 3 Encounters:  05/04/19 178 lb 9.6 oz (81 kg)  03/30/19 179 lb (81.2 kg)  12/28/18 183 lb (83 kg)    Current diet: well balanced  Continues to decline colon cancer screening.   Currently smoking 1/2 pack per day.  ------------------------------------------------------------------------    Allergies  Allergen Reactions  . Sulfa Antibiotics Anaphylaxis     Current Outpatient Medications:  .  amLODipine (NORVASC) 10 MG tablet, TAKE 1 TABLET BY MOUTH EVERY DAY, Disp: 90 tablet, Rfl: 0 .  atorvastatin (LIPITOR) 10 MG tablet, TAKE 1 TABLET BY MOUTH EVERY DAY, Disp: 90 tablet, Rfl: 0 .  hydrochlorothiazide (HYDRODIURIL) 25 MG tablet, Take 1 tablet (25 mg total) by mouth daily., Disp: 90 tablet, Rfl: 0 .  metoprolol succinate (TOPROL-XL) 25 MG 24 hr tablet, Take by mouth., Disp: , Rfl:   Review of Systems  Constitutional: Negative.   Respiratory: Negative.     Genitourinary: Negative.   Neurological: Negative.     Social History   Tobacco Use  . Smoking status: Heavy Tobacco Smoker    Packs/day: 0.50    Types: Cigarettes  . Smokeless tobacco: Never Used  Substance Use Topics  . Alcohol use: Yes    Alcohol/week: 7.0 standard drinks    Types: 7 Cans of beer per week      Objective:   BP (!) 154/94 (BP Location: Left Arm, Patient Position: Sitting, Cuff Size: Normal)   Pulse 86   Temp (!) 96.9 F (36.1 C) (Temporal)   Wt 178 lb 9.6 oz (81 kg)   BMI 23.56 kg/m  Vitals:   05/04/19 0807  BP: (!) 154/94  Pulse: 86  Temp: (!) 96.9 F (36.1 C)  TempSrc: Temporal  Weight: 178 lb 9.6 oz (81 kg)  Body mass index is 23.56 kg/m.   Physical Exam Constitutional:      Appearance: Normal appearance.  Cardiovascular:     Rate and Rhythm: Normal rate and regular rhythm.     Heart sounds: Normal heart sounds.  Pulmonary:     Effort: Pulmonary effort is normal.     Breath sounds: Normal breath sounds.  Skin:    General: Skin is warm and dry.  Neurological:     Mental Status: He is alert and oriented to person, place, and time. Mental status is at baseline.  Psychiatric:        Mood  and Affect: Mood normal.        Behavior: Behavior normal.      No results found for any visits on 05/04/19.     Assessment & Plan    1. Essential hypertension  Add lisinopril 20 mg daily, will have him take two tablets of the 10-12.5 mg lisinopril-HCTZ daily in addition to amlodipine 10 mg daily metoprolol succinate 25 mg daily. Follow up in 6 weeks.   The entirety of the information documented in the History of Present Illness, Review of Systems and Physical Exam were personally obtained by me. Portions of this information were initially documented by [facility], CMA and reviewed by me for thoroughness and accuracy.           Trinna Post, PA-C  [facility]

## 2019-05-04 ENCOUNTER — Encounter: Payer: Self-pay | Admitting: Physician Assistant

## 2019-05-04 ENCOUNTER — Other Ambulatory Visit: Payer: Self-pay

## 2019-05-04 ENCOUNTER — Ambulatory Visit (INDEPENDENT_AMBULATORY_CARE_PROVIDER_SITE_OTHER): Payer: PPO | Admitting: Physician Assistant

## 2019-05-04 VITALS — BP 138/78 | HR 86 | Temp 96.9°F | Wt 178.6 lb

## 2019-05-04 DIAGNOSIS — I10 Essential (primary) hypertension: Secondary | ICD-10-CM | POA: Diagnosis not present

## 2019-05-04 NOTE — Patient Instructions (Signed)

## 2019-05-05 MED ORDER — LISINOPRIL-HYDROCHLOROTHIAZIDE 20-25 MG PO TABS: 1.0000 | ORAL_TABLET | Freq: Every day | ORAL | 1 refills | Status: DC

## 2019-05-09 ENCOUNTER — Other Ambulatory Visit: Payer: Self-pay | Admitting: Physician Assistant

## 2019-05-09 DIAGNOSIS — E785 Hyperlipidemia, unspecified: Secondary | ICD-10-CM

## 2019-06-04 ENCOUNTER — Telehealth: Payer: Self-pay | Admitting: Physician Assistant

## 2019-06-04 NOTE — Telephone Encounter (Signed)
Coricidin and anything like Allegra or Zyrtec. No DayQuil or Sudafed

## 2019-06-04 NOTE — Telephone Encounter (Signed)
Pt takes BP medications and he called to ask the nurse what over the counter sinus meds can he take that wont interfere or react with his BP meds/ please advise asap

## 2019-06-04 NOTE — Telephone Encounter (Signed)
Advised 

## 2019-06-09 ENCOUNTER — Encounter: Payer: Self-pay | Admitting: Emergency Medicine

## 2019-06-09 ENCOUNTER — Other Ambulatory Visit: Payer: Self-pay

## 2019-06-09 ENCOUNTER — Emergency Department: Payer: PPO

## 2019-06-09 ENCOUNTER — Emergency Department
Admission: EM | Admit: 2019-06-09 | Discharge: 2019-06-09 | Disposition: A | Discharge status: Home / Self Care | Payer: PPO | Attending: Emergency Medicine | Admitting: Emergency Medicine

## 2019-06-09 DIAGNOSIS — E860 Dehydration: Secondary | ICD-10-CM

## 2019-06-09 DIAGNOSIS — R05 Cough: Secondary | ICD-10-CM | POA: Diagnosis not present

## 2019-06-09 DIAGNOSIS — R42 Dizziness and giddiness: Secondary | ICD-10-CM | POA: Diagnosis present

## 2019-06-09 DIAGNOSIS — Z79899 Other long term (current) drug therapy: Secondary | ICD-10-CM | POA: Diagnosis not present

## 2019-06-09 DIAGNOSIS — F17200 Nicotine dependence, unspecified, uncomplicated: Secondary | ICD-10-CM | POA: Diagnosis not present

## 2019-06-09 DIAGNOSIS — I10 Essential (primary) hypertension: Secondary | ICD-10-CM | POA: Insufficient documentation

## 2019-06-09 DIAGNOSIS — R000 Tachycardia, unspecified: Secondary | ICD-10-CM | POA: Diagnosis not present

## 2019-06-09 LAB — CBC
HCT: 51.5 % (ref 39.0–52.0)
Hemoglobin: 17.3 g/dL — ABNORMAL HIGH (ref 13.0–17.0)
MCH: 29.9 pg (ref 26.0–34.0)
MCHC: 33.6 g/dL (ref 30.0–36.0)
MCV: 89.1 fL (ref 80.0–100.0)
Platelets: 216 10*3/uL (ref 150–400)
RBC: 5.78 MIL/uL (ref 4.22–5.81)
RDW: 12.4 % (ref 11.5–15.5)
WBC: 6.6 10*3/uL (ref 4.0–10.5)
nRBC: 0 % (ref 0.0–0.2)

## 2019-06-09 LAB — BASIC METABOLIC PANEL
Anion gap: 12 (ref 5–15)
BUN: 72 mg/dL — ABNORMAL HIGH (ref 8–23)
CO2: 26 mmol/L (ref 22–32)
Calcium: 9.5 mg/dL (ref 8.9–10.3)
Chloride: 99 mmol/L (ref 98–111)
Creatinine, Ser: 2.44 mg/dL — ABNORMAL HIGH (ref 0.61–1.24)
GFR calc Af Amer: 31 mL/min — ABNORMAL LOW (ref >60)
GFR calc non Af Amer: 27 mL/min — ABNORMAL LOW (ref >60)
Glucose, Bld: 145 mg/dL — ABNORMAL HIGH (ref 70–99)
Potassium: 3.5 mmol/L (ref 3.5–5.1)
Sodium: 137 mmol/L (ref 135–145)

## 2019-06-09 LAB — URINALYSIS, COMPLETE (UACMP) WITH MICROSCOPIC
Bacteria, UA: NONE SEEN
Bilirubin Urine: NEGATIVE
Glucose, UA: NEGATIVE mg/dL
Ketones, ur: 5 mg/dL — AB
Leukocytes,Ua: NEGATIVE
Nitrite: NEGATIVE
Protein, ur: NEGATIVE mg/dL
Specific Gravity, Urine: 1.017 (ref 1.005–1.030)
pH: 5 (ref 5.0–8.0)

## 2019-06-09 MED ORDER — SODIUM CHLORIDE 0.9% FLUSH: 3.0000 mL | Freq: Once | INTRAVENOUS | Status: DC

## 2019-06-09 MED ORDER — SODIUM CHLORIDE 0.9 % IV SOLN
1000.0000 mL | Freq: Once | INTRAVENOUS | Status: AC
  Administered 2019-06-09: 12:00:00 1000 mL via INTRAVENOUS

## 2019-06-09 MED ORDER — ONDANSETRON 4 MG PO TBDP: 4.0000 mg | ORAL_TABLET | Freq: Three times a day (TID) | ORAL | 0 refills | Status: DC | PRN

## 2019-06-09 MED ORDER — SODIUM CHLORIDE 0.9 % IV SOLN
1000.0000 mL | Freq: Once | INTRAVENOUS | Status: AC
  Administered 2019-06-09: 1000 mL via INTRAVENOUS

## 2019-06-09 NOTE — Discharge Instructions (Signed)
It is important for you to follow-up with your PCP for recheck of your kidney function within 1 week

## 2019-06-09 NOTE — ED Notes (Signed)
EDP at bedside  

## 2019-06-09 NOTE — ED Triage Notes (Signed)
Dizziness/blurred vision x2 days , Abd discomfort (nausea) " my sinus have been draining down the back of my throat"

## 2019-06-09 NOTE — ED Notes (Signed)
Pt c/o not feeling well for the past few days. N/VD, runny nose, no appetite.  Denies known COVID exposure. PT in NAD at this time

## 2019-06-09 NOTE — ED Provider Notes (Signed)
[facility] Emergency Department Provider Note   ____________________________________________    I have reviewed the triage vital signs and the nursing notes.   HISTORY  Chief Complaint Dizziness     HPI Richard Lamb is a 69 y.o. male with history as noted below who presents with complaints of mild fatigue and dizziness intermittently over the last several days.  Does note some nausea and 1-2 episodes of vomiting.  No diarrhea, normal stools.  No abdominal pain at this time.  Reports that he feels well overall.  No fevers or chills.  Mild cough noted.  No shortness of breath.  Is not take anything for this  Past Medical History:  Diagnosis Date  . Anxiety   . GERD (gastroesophageal reflux disease)   . Hypertension     Patient Active Problem List   Diagnosis Date Noted  . Essential hypertension 06/29/2018  . Calculus of bladder 06/03/2018  . Lower urinary tract symptoms 06/03/2018    Past Surgical History:  Procedure Laterality Date  . CYSTOSCOPY WITH LITHOLAPAXY N/A 06/30/2018   Procedure: CYSTOSCOPY WITH LITHOLAPAXY;  Surgeon: Riki Altes, MD;  Location: [facility] ORS;  Service: Urology;  Laterality: N/A;  . PROSTATE SURGERY     Dr.Wolfe    Prior to Admission medications   Medication Sig Start Date End Date Taking? Authorizing Provider  amLODipine (NORVASC) 10 MG tablet TAKE 1 TABLET BY MOUTH EVERY DAY 04/27/19   [name], PA-C  atorvastatin (LIPITOR) 10 MG tablet TAKE 1 TABLET BY MOUTH EVERY DAY 05/10/19   [name] M, PA-C  hydrochlorothiazide (HYDRODIURIL) 25 MG tablet Take 1 tablet (25 mg total) by mouth daily. 03/30/19   [name], PA-C  lisinopril-hydrochlorothiazide (ZESTORETIC) 20-25 MG tablet Take 1 tablet by mouth daily. 05/05/19   [name], PA-C  metoprolol succinate (TOPROL-XL) 25 MG 24 hr tablet Take by mouth. 06/25/18 06/25/19  [provider]  ondansetron (ZOFRAN ODT) 4 MG  disintegrating tablet Take 1 tablet (4 mg total) by mouth every 8 (eight) hours as needed. 06/09/19   Jene Every, MD     Allergies Sulfa antibiotics  No family history on file.  Social History Social History   Tobacco Use  . Smoking status: Heavy Tobacco Smoker    Packs/day: 0.50    Types: Cigarettes  . Smokeless tobacco: Never Used  Substance Use Topics  . Alcohol use: Yes    Alcohol/week: 7.0 standard drinks    Types: 7 Cans of beer per week  . Drug use: Yes    Types: Marijuana    Review of Systems  Constitutional: No fever/chills Eyes: No visual changes.  ENT: No sore throat. Cardiovascular: Denies chest pain. Respiratory: Denies shortness of breath.  Positive cough Gastrointestinal: As above Genitourinary: Negative for dysuria. Musculoskeletal: Negative for back pain. Skin: Negative for rash. Neurological: Negative for headaches    ____________________________________________   PHYSICAL EXAM:  VITAL SIGNS: ED Triage Vitals  Enc Vitals Group     BP 06/09/19 0711 (!) 151/128     Pulse Rate 06/09/19 0711 (!) 131     Resp 06/09/19 0711 18     Temp 06/09/19 0711 98 F (36.7 C)     Temp Source 06/09/19 0711 Oral     SpO2 06/09/19 0711 100 %     Weight 06/09/19 0712 68 kg (150 lb)     Height 06/09/19 0712 1.829 m (6')     Head Circumference --  Peak Flow --      Pain Score 06/09/19 0711 0     Pain Loc --      Pain Edu? --      Excl. in [place]? --     Constitutional: Alert and oriented.  Nose: No congestion/rhinnorhea. Mouth/Throat: Mucous membranes are moist.    Cardiovascular: Normal rate, regular rhythm.  Good peripheral circulation. Respiratory: Normal respiratory effort.  No retractions. Lungs CTAB. Gastrointestinal: Soft and nontender. No distention.    Musculoskeletal: No lower extremity tenderness nor edema.  Warm and well perfused Neurologic:  Normal speech and language. No gross focal neurologic deficits are appreciated.  Skin:  Skin  is warm, dry and intact. No rash noted. Psychiatric: Mood and affect are normal. Speech and behavior are normal.  ____________________________________________   LABS (all labs ordered are listed, but only abnormal results are displayed)  Labs Reviewed  BASIC METABOLIC PANEL - Abnormal; Notable for the following components:      Result Value   Glucose, Bld 145 (*)    BUN 72 (*)    Creatinine, Ser 2.44 (*)    GFR calc non Af Amer 27 (*)    GFR calc Af Amer 31 (*)    All other components within normal limits  CBC - Abnormal; Notable for the following components:   Hemoglobin 17.3 (*)    All other components within normal limits  URINALYSIS, COMPLETE (UACMP) WITH MICROSCOPIC - Abnormal; Notable for the following components:   Color, Urine YELLOW (*)    APPearance CLEAR (*)    Hgb urine dipstick SMALL (*)    Ketones, ur 5 (*)    All other components within normal limits   ____________________________________________  EKG  ED ECG REPORT I, Lavonia Drafts, the attending physician, personally viewed and interpreted this ECG.  Date: 06/09/2019  Rhythm: normal sinus rhythm QRS Axis: normal Intervals: normal ST/T Wave abnormalities: normal Narrative Interpretation: Premature atrial  contractions  ____________________________________________  RADIOLOGY  Chest x-ray ____________________________________________   PROCEDURES  Procedure(s) performed: No  Procedures   Critical Care performed: No ____________________________________________   INITIAL IMPRESSION / ASSESSMENT AND PLAN / ED COURSE  Pertinent labs & imaging results that were available during my care of the patient were reviewed by me and considered in my medical decision making (see chart for details).  Patient presents with complaints as noted above, currently feels quite well and has no complaints.  He notes that he was began feeling better while in the waiting room.  Lab work is significant for elevated  creatinine, most recent creatinine I am able to obtain his 1 year ago which was normal at that time.  He has significantly elevated BUN suggesting dehydration as the cause.  Otherwise his lab work is reassuring.  After discussion with the patient regarding his elevated creatinine level we will give 2 L IV fluids here in the emergency department and he would like to have his creatinine checked again later this week as opposed to being admitted to the hospital.  I think this is a reasonable plan as this may be a chronic elevation of his creatinine when he is otherwise very well-appearing and in no acute distress.   ____________________________________________   FINAL CLINICAL IMPRESSION(S) / ED DIAGNOSES  Final diagnoses:  Dehydration        Note:  This document was prepared using Dragon voice recognition software and may include unintentional dictation errors.   Lavonia Drafts, MD 06/11/19 [phone]

## 2019-06-14 NOTE — Progress Notes (Signed)
Patient: Richard Lamb Male    DOB: 10-25-1950   69 y.o.   MRN: [id number] Visit Date: 06/15/2019  Today's Provider: Trinna Post, PA-C   Chief Complaint  Patient presents with  . Hypertension   Subjective:     HPI  Hypertension, follow-up:  BP Readings from Last 3 Encounters:  06/15/19 116/81  06/09/19 (!) 137/110  05/04/19 138/78    He was last seen for hypertension 6 weeks ago.  BP at that visit was 138/78. Management changes since that visit include:add lisinopril 20 mg daily, will have him take two tablets of the 10-12.5 mg lisinopril-HCTZ daily in addition to amlodipine 10 mg daily metoprolol succinate 25 mg daily.  Marland Kitchen  He is currently taking one tablet of the 10-12.5mg  daily of lisinopril-HCTZ as he was recently treated in the ER for dehydration. His Scr on 06/09/2019 was 2.44. He reports he was nauseated, vomited, and did not have much of an appetite at that time. He also reports diarrhea.    He reports good compliance with treatment. He is not having side effects.  He is exercising. He is adherent to low salt diet.   Outside blood pressures are not changing at home. He is experiencing none.  Patient denies chest pain, chest pressure/discomfort, fatigue, irregular heart beat, lower extremity edema and palpitations.   Cardiovascular risk factors include advanced age (older than 54 for men, 99 for women), hypertension and male gender.  Use of agents associated with hypertension: none.     Weight trend: fluctuating a bit Wt Readings from Last 3 Encounters:  06/15/19 165 lb 12.8 oz (75.2 kg)  06/09/19 150 lb (68 kg)  05/04/19 178 lb 9.6 oz (81 kg)    Current diet: well balanced  ------------------------------------------------------------------------     Allergies  Allergen Reactions  . Sulfa Antibiotics Anaphylaxis     Current Outpatient Medications:  .  amLODipine (NORVASC) 10 MG tablet, TAKE 1 TABLET BY MOUTH EVERY DAY, Disp: 90  tablet, Rfl: 0 .  atorvastatin (LIPITOR) 10 MG tablet, TAKE 1 TABLET BY MOUTH EVERY DAY, Disp: 90 tablet, Rfl: 0 .  hydrochlorothiazide (HYDRODIURIL) 25 MG tablet, Take 1 tablet (25 mg total) by mouth daily., Disp: 90 tablet, Rfl: 0 .  lisinopril-hydrochlorothiazide (ZESTORETIC) 20-25 MG tablet, Take 1 tablet by mouth daily., Disp: 90 tablet, Rfl: 1 .  metoprolol succinate (TOPROL-XL) 25 MG 24 hr tablet, Take by mouth., Disp: , Rfl:  .  ondansetron (ZOFRAN ODT) 4 MG disintegrating tablet, Take 1 tablet (4 mg total) by mouth every 8 (eight) hours as needed., Disp: 20 tablet, Rfl: 0  Review of Systems  Constitutional: Negative.   Respiratory: Negative.   Cardiovascular: Negative.   Gastrointestinal: Negative.   Musculoskeletal: Negative.     Social History   Tobacco Use  . Smoking status: Heavy Tobacco Smoker    Packs/day: 0.50    Types: Cigarettes  . Smokeless tobacco: Never Used  Substance Use Topics  . Alcohol use: Yes    Alcohol/week: 7.0 standard drinks    Types: 7 Cans of beer per week      Objective:   BP 116/81 (BP Location: Left Arm, Patient Position: Sitting, Cuff Size: Normal)   Pulse (!) 112   Temp (!) 96.5 F (35.8 C) (Temporal)   Wt 165 lb 12.8 oz (75.2 kg)   BMI 22.49 kg/m  Vitals:   06/15/19 0813  BP: 116/81  Pulse: (!) 112  Temp: (!) 96.5 F (35.8  C)  TempSrc: Temporal  Weight: 165 lb 12.8 oz (75.2 kg)  Body mass index is 22.49 kg/m.   Physical Exam Constitutional:      Appearance: Normal appearance.  Cardiovascular:     Rate and Rhythm: Normal rate and regular rhythm.     Heart sounds: Normal heart sounds.  Pulmonary:     Breath sounds: Examination of the right-lower field reveals wheezing. Wheezing present.  Skin:    General: Skin is warm and dry.  Neurological:     Mental Status: He is alert and oriented to person, place, and time. Mental status is at baseline.  Psychiatric:        Mood and Affect: Mood normal.        Behavior: Behavior  normal.      No results found for any visits on 06/15/19.     Assessment & Plan    1. Essential hypertension  Controlled on 10-12.5 mg lisinopril-HCTZ, amlodipine and metorpolol. However, Scr on 06/09/2019 was 2.44 up from baseline of 1.1. He was treated for dehydration and reported GI illness at that time. I will recheck Scr for improvement. If improving, we may monitor but if consistently very elevated I will stop Lisinopril.   Contact numbers given for COVID vaccine sign up in [facility] and [facility].   - Comprehensive Metabolic Panel (CMET)  2. Elevated serum creatinine  The entirety of the information documented in the History of Present Illness, Review of Systems and Physical Exam were personally obtained by me. Portions of this information were initially documented by [facility] and reviewed by me for thoroughness and accuracy.         [name], PA-C  [facility]

## 2019-06-15 ENCOUNTER — Encounter: Payer: Self-pay | Admitting: Physician Assistant

## 2019-06-15 ENCOUNTER — Ambulatory Visit (INDEPENDENT_AMBULATORY_CARE_PROVIDER_SITE_OTHER): Payer: PPO | Admitting: Physician Assistant

## 2019-06-15 ENCOUNTER — Other Ambulatory Visit: Payer: Self-pay

## 2019-06-15 VITALS — BP 116/81 | HR 112 | Temp 96.5°F | Wt 165.8 lb

## 2019-06-15 DIAGNOSIS — I10 Essential (primary) hypertension: Secondary | ICD-10-CM | POA: Diagnosis not present

## 2019-06-15 DIAGNOSIS — R7989 Other specified abnormal findings of blood chemistry: Secondary | ICD-10-CM

## 2019-06-15 NOTE — Patient Instructions (Signed)

## 2019-06-16 LAB — COMPREHENSIVE METABOLIC PANEL
ALT: 22 IU/L (ref 0–44)
AST: 19 IU/L (ref 0–40)
Albumin/Globulin Ratio: 1.9 (ref 1.2–2.2)
Albumin: 4.1 g/dL (ref 3.8–4.8)
Alkaline Phosphatase: 56 IU/L (ref 39–117)
BUN/Creatinine Ratio: 24 (ref 10–24)
BUN: 45 mg/dL — ABNORMAL HIGH (ref 8–27)
Bilirubin Total: 0.5 mg/dL (ref 0.0–1.2)
CO2: 21 mmol/L (ref 20–29)
Calcium: 9.5 mg/dL (ref 8.6–10.2)
Chloride: 93 mmol/L — ABNORMAL LOW (ref 96–106)
Creatinine, Ser: 1.88 mg/dL — ABNORMAL HIGH (ref 0.76–1.27)
GFR calc Af Amer: 42 mL/min/{1.73_m2} — ABNORMAL LOW (ref >59)
GFR calc non Af Amer: 36 mL/min/{1.73_m2} — ABNORMAL LOW (ref >59)
Globulin, Total: 2.2 g/dL (ref 1.5–4.5)
Glucose: 114 mg/dL — ABNORMAL HIGH (ref 65–99)
Potassium: 3.8 mmol/L (ref 3.5–5.2)
Sodium: 136 mmol/L (ref 134–144)
Total Protein: 6.3 g/dL (ref 6.0–8.5)

## 2019-06-18 ENCOUNTER — Telehealth: Payer: Self-pay

## 2019-06-18 NOTE — Telephone Encounter (Signed)
-----   Message from [name], [place] sent at 06/18/2019 12:21 PM EST ----- Kidney function is improving but is still lower than his normal. Would like him to follow up in one month and we will check his BP and labs again.

## 2019-06-18 NOTE — Telephone Encounter (Signed)
Patient was advised and schedule appointment for 07/20/2019 @ 9:40 AM.

## 2019-06-28 ENCOUNTER — Other Ambulatory Visit: Payer: Self-pay | Admitting: Physician Assistant

## 2019-06-28 DIAGNOSIS — I10 Essential (primary) hypertension: Secondary | ICD-10-CM

## 2019-07-03 ENCOUNTER — Ambulatory Visit: Payer: PPO | Attending: Internal Medicine

## 2019-07-03 DIAGNOSIS — Z23 Encounter for immunization: Secondary | ICD-10-CM | POA: Insufficient documentation

## 2019-07-03 NOTE — Progress Notes (Signed)
   Covid-19 Vaccination Clinic  Name:  Zakarie Sturdivant    MRN: [id number] DOB: [birth year]  07/03/2019  Mr. Maranan was observed post Covid-19 immunization for 15 minutes without incidence. He was provided with Vaccine Information Sheet and instruction to access the V-Safe system.   Mr. Speagle was instructed to call 911 with any severe reactions post vaccine: Marland Kitchen Difficulty breathing  . Swelling of your face and throat  . A fast heartbeat  . A bad rash all over your body  . Dizziness and weakness    Immunizations Administered    Name Date Dose VIS Date Route   Pfizer COVID-19 Vaccine 07/03/2019 11:18 AM 0.3 mL 05/07/2019 Intramuscular   Manufacturer: [employer], [employer]   Lot: [id number]   NDC: [address]-7

## 2019-07-20 ENCOUNTER — Other Ambulatory Visit: Payer: Self-pay | Admitting: Physician Assistant

## 2019-07-20 ENCOUNTER — Encounter: Payer: Self-pay | Admitting: Physician Assistant

## 2019-07-20 ENCOUNTER — Other Ambulatory Visit: Payer: Self-pay

## 2019-07-20 ENCOUNTER — Ambulatory Visit (INDEPENDENT_AMBULATORY_CARE_PROVIDER_SITE_OTHER): Payer: PPO | Admitting: Physician Assistant

## 2019-07-20 VITALS — BP 128/80 | HR 77 | Temp 96.6°F | Resp 16 | Ht 72.0 in | Wt 176.0 lb

## 2019-07-20 DIAGNOSIS — I10 Essential (primary) hypertension: Secondary | ICD-10-CM | POA: Diagnosis not present

## 2019-07-20 DIAGNOSIS — E785 Hyperlipidemia, unspecified: Secondary | ICD-10-CM | POA: Diagnosis not present

## 2019-07-20 DIAGNOSIS — R7989 Other specified abnormal findings of blood chemistry: Secondary | ICD-10-CM | POA: Diagnosis not present

## 2019-07-20 MED ORDER — AMLODIPINE BESYLATE 10 MG PO TABS: 10.0000 mg | ORAL_TABLET | Freq: Every day | ORAL | 1 refills | Status: DC

## 2019-07-20 MED ORDER — ATORVASTATIN CALCIUM 10 MG PO TABS: 10.0000 mg | ORAL_TABLET | Freq: Every day | ORAL | 1 refills | Status: DC

## 2019-07-20 MED ORDER — METOPROLOL SUCCINATE ER 25 MG PO TB24: 25.0000 mg | ORAL_TABLET | Freq: Every day | ORAL | 3 refills | Status: DC

## 2019-07-20 NOTE — Progress Notes (Signed)
Patient: Richard Lamb Male    DOB: [birth year]   69 y.o.   MRN: [id number] Visit Date: 07/20/2019  Today's Provider: Trinna Post, PA-C   Chief Complaint  Patient presents with  . Hypertension   Subjective:    I [place] S. Dimas, CMA, am acting as scribe for [employer], PA-C.   HPI  Hypertension, follow-up:  BP Readings from Last 3 Encounters:  07/20/19 128/80  06/15/19 116/81  06/09/19 (!) 137/110    He was last seen for hypertension 1 months ago.  BP at that visit was 116/81. Management changes since that visit include no changes. Controlled on 10-12.5 mg lisinopril-HCTZ, amlodipine and metorpolol. However, Scr on 06/09/2019 was 2.44 up from baseline of 1.1. He was treated for dehydration and reported GI illness at that time. I will recheck Scr for improvement. If improving, we may monitor but if consistently very elevated I will stop Lisinopril.  He reports excellent compliance with treatment. He is not having side effects.  He is exercising. He is adherent to low salt diet.   Outside blood pressures are not being checked. He is experiencing none.  Patient denies chest pain.   Cardiovascular risk factors include advanced age (older than 36 for men, 32 for women), hypertension and smoking/ tobacco exposure.  Use of agents associated with hypertension: none.     Weight trend: stable Wt Readings from Last 3 Encounters:  07/20/19 176 lb (79.8 kg)  06/15/19 165 lb 12.8 oz (75.2 kg)  06/09/19 150 lb (68 kg)    Current diet: in general, a "healthy" diet    ------------------------------------------------------------------------    Allergies  Allergen Reactions  . Sulfa Antibiotics Anaphylaxis     Current Outpatient Medications:  .  amLODipine (NORVASC) 10 MG tablet, TAKE 1 TABLET BY MOUTH EVERY DAY, Disp: 90 tablet, Rfl: 0 .  atorvastatin (LIPITOR) 10 MG tablet, TAKE 1 TABLET BY MOUTH EVERY DAY, Disp: 90 tablet, Rfl: 0 .   hydrochlorothiazide (HYDRODIURIL) 25 MG tablet, TAKE 1 TABLET BY MOUTH EVERY DAY, Disp: 90 tablet, Rfl: 0 .  lisinopril-hydrochlorothiazide (ZESTORETIC) 20-25 MG tablet, Take 1 tablet by mouth daily., Disp: 90 tablet, Rfl: 1 .  metoprolol succinate (TOPROL-XL) 25 MG 24 hr tablet, Take by mouth., Disp: , Rfl:  .  ondansetron (ZOFRAN ODT) 4 MG disintegrating tablet, Take 1 tablet (4 mg total) by mouth every 8 (eight) hours as needed. (Patient not taking: Reported on 07/20/2019), Disp: 20 tablet, Rfl: 0  Review of Systems  Constitutional: Negative.   Eyes: Negative.   Respiratory: Negative.   Cardiovascular: Negative.     Social History   Tobacco Use  . Smoking status: Heavy Tobacco Smoker    Packs/day: 0.50    Types: Cigarettes  . Smokeless tobacco: Never Used  Substance Use Topics  . Alcohol use: Yes    Alcohol/week: 7.0 standard drinks    Types: 7 Cans of beer per week      Objective:   BP 128/80 (BP Location: Left Arm, Patient Position: Sitting, Cuff Size: Large)   Pulse 77   Temp (!) 96.6 F (35.9 C) (Temporal)   Resp 16   Ht 6' (1.829 m)   Wt 176 lb (79.8 kg)   BMI 23.87 kg/m  Vitals:   07/20/19 0938  BP: 128/80  Pulse: 77  Resp: 16  Temp: (!) 96.6 F (35.9 C)  TempSrc: Temporal  Weight: 176 lb (79.8 kg)  Height: 6' (1.829 m)  Body  mass index is 23.87 kg/m.   Physical Exam Constitutional:      Appearance: Normal appearance.  Cardiovascular:     Rate and Rhythm: Normal rate and regular rhythm.     Heart sounds: Normal heart sounds.  Pulmonary:     Effort: Pulmonary effort is normal.     Breath sounds: Normal breath sounds.  Neurological:     Mental Status: He is alert and oriented to person, place, and time. Mental status is at baseline.  Psychiatric:        Mood and Affect: Mood normal.        Behavior: Behavior normal.      No results found for any visits on 07/20/19.     Assessment & Plan    1. Essential hypertension  Need to recheck his  CMET and make sure his kidney function has improved. If not, will need to adjust medications. His BP is well controlled today. He is getting his second COVID vaccine today. F/u 6 months if kidney function improved, sooner if not.   - Comprehensive metabolic panel - metoprolol succinate (TOPROL-XL) 25 MG 24 hr tablet; Take 1 tablet (25 mg total) by mouth daily.  Dispense: 90 tablet; Refill: 3  2. Elevated serum creatinine  - Comprehensive metabolic panel  3. Hypertension, unspecified type  - amLODipine (NORVASC) 10 MG tablet; Take 1 tablet (10 mg total) by mouth daily.  Dispense: 90 tablet; Refill: 1  4. Hyperlipidemia, unspecified hyperlipidemia type  - atorvastatin (LIPITOR) 10 MG tablet; Take 1 tablet (10 mg total) by mouth daily.  Dispense: 90 tablet; Refill: 1  The entirety of the information documented in the History of Present Illness, Review of Systems and Physical Exam were personally obtained by me. Portions of this information were initially documented by Rondel Baton, CMA and reviewed by me for thoroughness and accuracy.       [name], PA-C  [facility]

## 2019-07-21 ENCOUNTER — Telehealth: Payer: Self-pay

## 2019-07-21 LAB — COMPREHENSIVE METABOLIC PANEL
ALT: 15 IU/L (ref 0–44)
AST: 17 IU/L (ref 0–40)
Albumin/Globulin Ratio: 1.7 (ref 1.2–2.2)
Albumin: 4 g/dL (ref 3.8–4.8)
Alkaline Phosphatase: 62 IU/L (ref 39–117)
BUN/Creatinine Ratio: 11 (ref 10–24)
BUN: 12 mg/dL (ref 8–27)
Bilirubin Total: 0.5 mg/dL (ref 0.0–1.2)
CO2: 24 mmol/L (ref 20–29)
Calcium: 9.8 mg/dL (ref 8.6–10.2)
Chloride: 102 mmol/L (ref 96–106)
Creatinine, Ser: 1.08 mg/dL (ref 0.76–1.27)
GFR calc Af Amer: 82 mL/min/{1.73_m2} (ref >59)
GFR calc non Af Amer: 71 mL/min/{1.73_m2} (ref >59)
Globulin, Total: 2.4 g/dL (ref 1.5–4.5)
Glucose: 93 mg/dL (ref 65–99)
Potassium: 4.5 mmol/L (ref 3.5–5.2)
Sodium: 140 mmol/L (ref 134–144)
Total Protein: 6.4 g/dL (ref 6.0–8.5)

## 2019-07-21 NOTE — Telephone Encounter (Signed)
Patient was advised and states that he will return in August for his follow-up appointment.

## 2019-07-21 NOTE — Telephone Encounter (Signed)
-----   Message from [name], [place] sent at 07/21/2019  9:07 AM EST ----- His kidney function has returned to normal and seemed to be due to his sickness and dehydration. We can continue the medication as prescribed and follow up in 6 months.

## 2019-07-27 ENCOUNTER — Ambulatory Visit: Payer: PPO | Attending: Internal Medicine

## 2019-07-27 DIAGNOSIS — Z23 Encounter for immunization: Secondary | ICD-10-CM | POA: Insufficient documentation

## 2019-07-27 NOTE — Progress Notes (Signed)
   Covid-19 Vaccination Clinic  Name:  Demetreus Lothamer    MRN: [id number] DOB: [birth year]  07/27/2019  Mr. Vanvoorhis was observed post Covid-19 immunization for 15 minutes without incident. He was provided with Vaccine Information Sheet and instruction to access the V-Safe system.   Mr. Fusselman was instructed to call 911 with any severe reactions post vaccine: Marland Kitchen Difficulty breathing  . Swelling of face and throat  . A fast heartbeat  . A bad rash all over body  . Dizziness and weakness   Immunizations Administered    Name Date Dose VIS Date Route   Pfizer COVID-19 Vaccine 07/27/2019  8:44 AM 0.3 mL 05/07/2019 Intramuscular   Manufacturer: [employer], [employer]   Lot: [id number]   NDC: [address]-5

## 2019-11-01 ENCOUNTER — Other Ambulatory Visit: Payer: Self-pay | Admitting: Physician Assistant

## 2019-11-01 DIAGNOSIS — I10 Essential (primary) hypertension: Secondary | ICD-10-CM

## 2019-12-27 ENCOUNTER — Telehealth: Payer: Self-pay | Admitting: Physician Assistant

## 2019-12-27 NOTE — Telephone Encounter (Signed)
Copied from CRM [id number]. Topic: Medicare AWV >> Dec 27, 2019 11:58 AM Claudette Laws R wrote: Reason for CRM:   Left message for patient to call back and schedule Medicare Annual Wellness Visit (AWV) either virtually or in office.  Last AWV  12/28/2018  Please schedule at anytime with [facility] Ad[facility].

## 2020-01-24 NOTE — Progress Notes (Signed)
Established patient visit   Patient: Richard Lamb   DOB: 11-Nov-1950   69 y.o. Male  MRN: [id number] Visit Date: 01/25/2020  Today's healthcare provider: [name], PA-C   Chief Complaint  Patient presents with  . Hypertension   Subjective    HPI  Hypertension, follow-up  BP Readings from Last 3 Encounters:  01/25/20 (!) 193/78  07/20/19 128/80  06/15/19 116/81   Wt Readings from Last 3 Encounters:  01/25/20 186 lb (84.4 kg)  07/20/19 176 lb (79.8 kg)  06/15/19 165 lb 12.8 oz (75.2 kg)     He was last seen for hypertension 6 months ago.  BP at that visit was 128/80. Management since that visit includes no changes.  He reports poor compliance with treatment. Not taking lisinopril-HCTZ. Taking other BP medications inconsitently.  He is not having side effects.  He is following a Regular diet. He is not exercising. He does smoke.  Use of agents associated with hypertension: none.   Outside blood pressures are not being checked. Symptoms: No chest pain No chest pressure  No palpitations No syncope  No dyspnea No orthopnea  No paroxysmal nocturnal dyspnea No lower extremity edema   Pertinent labs: Lab Results  Component Value Date   CHOL 216 (H) 06/23/2018   HDL 68 06/23/2018   LDLCALC 136 (H) 06/23/2018   TRIG 58 06/23/2018   CHOLHDL 3.2 06/23/2018   Lab Results  Component Value Date   NA 140 07/20/2019   K 4.5 07/20/2019   CREATININE 1.08 07/20/2019   GFRNONAA 71 07/20/2019   GFRAA 82 07/20/2019   GLUCOSE 93 07/20/2019     The 10-year ASCVD risk score [name], et al., 2013) is: 47.5%    Lipid/Cholesterol, Follow-up  Last lipid panel Other pertinent labs  Lab Results  Component Value Date   CHOL 216 (H) 06/23/2018   HDL 68 06/23/2018   LDLCALC 136 (H) 06/23/2018   TRIG 58 06/23/2018   CHOLHDL 3.2 06/23/2018   Lab Results  Component Value Date   ALT 15 07/20/2019   AST 17 07/20/2019   PLT 216 06/09/2019     He was  last seen for this 6 months ago.  Management since that visit includes no changes.  He reports good compliance with treatment. He is not having side effects.   Symptoms: No chest pain No chest pressure/discomfort  No dyspnea No lower extremity edema  No numbness or tingling of extremity No orthopnea  No palpitations No paroxysmal nocturnal dyspnea  No speech difficulty No syncope   Current diet: well balanced Current exercise: none  The 10-year ASCVD risk score [name], et al., 2013) is: 47.5%  Colon Cancer Screening  Consistently refuses. Promises he will do it next time.      Medications: Outpatient Medications Prior to Visit  Medication Sig  . [DISCONTINUED] amLODipine (NORVASC) 10 MG tablet Take 1 tablet (10 mg total) by mouth daily.  . [DISCONTINUED] atorvastatin (LIPITOR) 10 MG tablet Take 1 tablet (10 mg total) by mouth daily.  . [DISCONTINUED] lisinopril-hydrochlorothiazide (ZESTORETIC) 20-25 MG tablet TAKE 1 TABLET BY MOUTH EVERY DAY  . [DISCONTINUED] metoprolol succinate (TOPROL-XL) 25 MG 24 hr tablet Take 1 tablet (25 mg total) by mouth daily.  . [DISCONTINUED] ondansetron (ZOFRAN ODT) 4 MG disintegrating tablet Take 1 tablet (4 mg total) by mouth every 8 (eight) hours as needed. (Patient not taking: Reported on 07/20/2019)   No facility-administered medications prior to visit.  Review of Systems  Constitutional: Negative.   Respiratory: Negative.   Cardiovascular: Negative.   Gastrointestinal: Negative.   Endocrine: Negative.   Musculoskeletal: Negative.   Neurological: Negative.       Objective    BP (!) 193/78   Pulse 76   Temp 98.1 F (36.7 C)   Ht 6\' 1"  (1.854 m)   Wt 186 lb (84.4 kg)   BMI 24.54 kg/m    Physical Exam Constitutional:      Appearance: Normal appearance.  Cardiovascular:     Rate and Rhythm: Normal rate and regular rhythm.  Pulmonary:     Effort: Pulmonary effort is normal.     Breath sounds: Normal breath sounds.    Skin:    General: Skin is warm and dry.  Neurological:     Mental Status: He is alert and oriented to person, place, and time. Mental status is at baseline.  Psychiatric:        Mood and Affect: Mood normal.        Behavior: Behavior normal.       No results found for any visits on 01/25/20.  Assessment & Plan    1. Annual physical exam  Declines colon cancer screening.   2. Essential hypertension  Not compliant with medicines. Last visit his BP was normotensive. No hypertensive urgency signs today. Encouraged to split medicines up if he wants to. Labs at next visit.   - lisinopril-hydrochlorothiazide (ZESTORETIC) 20-25 MG tablet; Take 1 tablet by mouth daily.  Dispense: 90 tablet; Refill: 1 - metoprolol succinate (TOPROL-XL) 25 MG 24 hr tablet; Take 1 tablet (25 mg total) by mouth daily.  Dispense: 90 tablet; Refill: 1 - Comprehensive Metabolic Panel (CMET) - Lipid Profile  3. Hyperlipidemia, unspecified hyperlipidemia type  May miss a few days. Labs next visit.  - atorvastatin (LIPITOR) 10 MG tablet; Take 1 tablet (10 mg total) by mouth daily.  Dispense: 90 tablet; Refill: 1 - Comprehensive Metabolic Panel (CMET) - Lipid Profile  4. Hypertension, unspecified type  - amLODipine (NORVASC) 10 MG tablet; Take 1 tablet (10 mg total) by mouth daily.  Dispense: 90 tablet; Refill: 1  5. Need for vaccination against Streptococcus pneumoniae  Update pneumonia 23.   6. Tobacco abuse  Ready to quit, smoking 1/2 pack per day. Doesn't want medications. 10 pack year smoking history.   7. Colon cancer screening  Refuses, says he will do it next time.     Return in about 3 months (around 04/25/2020) for chronic .      I12/06/2019, PA-C, have reviewed all documentation for this visit. The documentation on 01/25/20 for the exam, diagnosis, procedures, and orders are all accurate and complete.    01/27/20  [facility] [phone]  (phone) [phone] (fax)  [facility]

## 2020-01-25 ENCOUNTER — Ambulatory Visit (INDEPENDENT_AMBULATORY_CARE_PROVIDER_SITE_OTHER): Payer: PPO | Admitting: Physician Assistant

## 2020-01-25 ENCOUNTER — Other Ambulatory Visit: Payer: Self-pay

## 2020-01-25 ENCOUNTER — Encounter: Payer: Self-pay | Admitting: Physician Assistant

## 2020-01-25 VITALS — BP 193/78 | HR 76 | Temp 98.1°F | Ht 73.0 in | Wt 186.0 lb

## 2020-01-25 DIAGNOSIS — Z0000 Encounter for general adult medical examination without abnormal findings: Secondary | ICD-10-CM | POA: Diagnosis not present

## 2020-01-25 DIAGNOSIS — Z1211 Encounter for screening for malignant neoplasm of colon: Secondary | ICD-10-CM | POA: Diagnosis not present

## 2020-01-25 DIAGNOSIS — E785 Hyperlipidemia, unspecified: Secondary | ICD-10-CM

## 2020-01-25 DIAGNOSIS — Z23 Encounter for immunization: Secondary | ICD-10-CM | POA: Diagnosis not present

## 2020-01-25 DIAGNOSIS — I10 Essential (primary) hypertension: Secondary | ICD-10-CM | POA: Diagnosis not present

## 2020-01-25 DIAGNOSIS — Z720 Tobacco use: Secondary | ICD-10-CM

## 2020-01-25 MED ORDER — LISINOPRIL-HYDROCHLOROTHIAZIDE 20-25 MG PO TABS: 1.0000 | ORAL_TABLET | Freq: Every day | ORAL | 1 refills | Status: DC

## 2020-01-25 MED ORDER — ATORVASTATIN CALCIUM 10 MG PO TABS: 10.0000 mg | ORAL_TABLET | Freq: Every day | ORAL | 1 refills | Status: DC

## 2020-01-25 MED ORDER — AMLODIPINE BESYLATE 10 MG PO TABS: 10.0000 mg | ORAL_TABLET | Freq: Every day | ORAL | 1 refills | Status: DC

## 2020-01-25 MED ORDER — METOPROLOL SUCCINATE ER 25 MG PO TB24: 25.0000 mg | ORAL_TABLET | Freq: Every day | ORAL | 1 refills | Status: DC

## 2020-01-25 NOTE — Patient Instructions (Signed)

## 2020-01-26 NOTE — Addendum Note (Signed)
Addended by: Anson [place] on: 01/26/2020 10:54 AM   Modules accepted: Orders

## 2020-01-30 ENCOUNTER — Other Ambulatory Visit: Payer: Self-pay

## 2020-01-30 ENCOUNTER — Encounter: Payer: Self-pay | Admitting: Emergency Medicine

## 2020-01-30 ENCOUNTER — Emergency Department
Admission: EM | Admit: 2020-01-30 | Discharge: 2020-01-30 | Disposition: A | Discharge status: Home / Self Care | Payer: PPO | Attending: Emergency Medicine | Admitting: Emergency Medicine

## 2020-01-30 DIAGNOSIS — F17210 Nicotine dependence, cigarettes, uncomplicated: Secondary | ICD-10-CM | POA: Diagnosis not present

## 2020-01-30 DIAGNOSIS — E860 Dehydration: Secondary | ICD-10-CM | POA: Diagnosis not present

## 2020-01-30 DIAGNOSIS — R110 Nausea: Secondary | ICD-10-CM | POA: Diagnosis not present

## 2020-01-30 DIAGNOSIS — I10 Essential (primary) hypertension: Secondary | ICD-10-CM | POA: Diagnosis not present

## 2020-01-30 DIAGNOSIS — Z79899 Other long term (current) drug therapy: Secondary | ICD-10-CM | POA: Insufficient documentation

## 2020-01-30 LAB — COMPREHENSIVE METABOLIC PANEL
ALT: 27 U/L (ref 0–44)
AST: 38 U/L (ref 15–41)
Albumin: 4.4 g/dL (ref 3.5–5.0)
Alkaline Phosphatase: 43 U/L (ref 38–126)
Anion gap: 10 (ref 5–15)
BUN: 26 mg/dL — ABNORMAL HIGH (ref 8–23)
CO2: 26 mmol/L (ref 22–32)
Calcium: 9.6 mg/dL (ref 8.9–10.3)
Chloride: 94 mmol/L — ABNORMAL LOW (ref 98–111)
Creatinine, Ser: 1.37 mg/dL — ABNORMAL HIGH (ref 0.61–1.24)
GFR calc Af Amer: >60 mL/min (ref >60)
GFR calc non Af Amer: 53 mL/min — ABNORMAL LOW (ref >60)
Glucose, Bld: 129 mg/dL — ABNORMAL HIGH (ref 70–99)
Potassium: 3.7 mmol/L (ref 3.5–5.1)
Sodium: 130 mmol/L — ABNORMAL LOW (ref 135–145)
Total Bilirubin: 1.2 mg/dL (ref 0.3–1.2)
Total Protein: 7.4 g/dL (ref 6.5–8.1)

## 2020-01-30 LAB — CBC
HCT: 48.9 % (ref 39.0–52.0)
Hemoglobin: 16.7 g/dL (ref 13.0–17.0)
MCH: 30.7 pg (ref 26.0–34.0)
MCHC: 34.2 g/dL (ref 30.0–36.0)
MCV: 89.9 fL (ref 80.0–100.0)
Platelets: 222 10*3/uL (ref 150–400)
RBC: 5.44 MIL/uL (ref 4.22–5.81)
RDW: 13.5 % (ref 11.5–15.5)
WBC: 5.6 10*3/uL (ref 4.0–10.5)
nRBC: 0 % (ref 0.0–0.2)

## 2020-01-30 LAB — LIPASE, BLOOD: Lipase: 26 U/L (ref 11–51)

## 2020-01-30 MED ORDER — ONDANSETRON HCL 4 MG/2ML IJ SOLN: 4.0000 mg | Freq: Once | INTRAMUSCULAR | Status: DC | PRN

## 2020-01-30 MED ORDER — SODIUM CHLORIDE 0.9 % IV BOLUS
1000.0000 mL | Freq: Once | INTRAVENOUS | Status: AC
  Administered 2020-01-30: 1000 mL via INTRAVENOUS

## 2020-01-30 NOTE — ED Provider Notes (Signed)
[facility] Emergency Department Provider Note  ____________________________________________  Time seen: Approximately 1:01 PM  I have reviewed the triage vital signs and the nursing notes.   HISTORY  Chief Complaint Abdominal Pain    HPI Richard Lamb is a 69 y.o. male who presents the emergency department complaining of cramping which she states feels like "when I was dehydrated before."  Patient states that he saw his primary care this past week, his lisinopril HCTZ dosing was increased.  Patient states that since then he has been "peeing all the time."  Patient states that he has been trying to drink plenty of water but states that while he was at work last night he started having cramping in his extremities and throughout his abdomen.  Patient states that this has improved at this time but he feels like he is "dehydrated."  Patient is concerned that the medication may be "pulling too much fluid off of me."  Patient denies any headache, visual changes, URI symptoms, chest pain, abdominal pain, nausea vomiting, diarrhea or constipation.         Past Medical History:  Diagnosis Date  . Anxiety   . GERD (gastroesophageal reflux disease)   . Hypertension     Patient Active Problem List   Diagnosis Date Noted  . Essential hypertension 06/29/2018  . Calculus of bladder 06/03/2018  . Lower urinary tract symptoms 06/03/2018    Past Surgical History:  Procedure Laterality Date  . CYSTOSCOPY WITH LITHOLAPAXY N/A 06/30/2018   Procedure: CYSTOSCOPY WITH LITHOLAPAXY;  Surgeon: Riki Altes, MD;  Location: [facility] ORS;  Service: Urology;  Laterality: N/A;  . PROSTATE SURGERY     Dr.Wolfe    Prior to Admission medications   Medication Sig Start Date End Date Taking? Authorizing Provider  amLODipine (NORVASC) 10 MG tablet Take 1 tablet (10 mg total) by mouth daily. 01/25/20   [name], PA-C  atorvastatin (LIPITOR) 10 MG tablet Take 1 tablet (10  mg total) by mouth daily. 01/25/20   [name], PA-C  lisinopril-hydrochlorothiazide (ZESTORETIC) 20-25 MG tablet Take 1 tablet by mouth daily. 01/25/20   [name], PA-C  metoprolol succinate (TOPROL-XL) 25 MG 24 hr tablet Take 1 tablet (25 mg total) by mouth daily. 01/25/20 01/24/21  [name], PA-C    Allergies Sulfa antibiotics  No family history on file.  Social History Social History   Tobacco Use  . Smoking status: Heavy Tobacco Smoker    Packs/day: 0.50    Types: Cigarettes  . Smokeless tobacco: Never Used  Vaping Use  . Vaping Use: Never used  Substance Use Topics  . Alcohol use: Yes    Alcohol/week: 7.0 standard drinks    Types: 7 Cans of beer per week  . Drug use: Yes    Types: Marijuana     Review of Systems  Constitutional: No fever/chills.  Positive for cramping consistent with previous dehydration Eyes: No visual changes. No discharge ENT: No upper respiratory complaints. Cardiovascular: no chest pain. Respiratory: no cough. No SOB. Gastrointestinal: No abdominal pain.  No nausea, no vomiting.  No diarrhea.  No constipation. Genitourinary: Negative for dysuria. No hematuria Musculoskeletal: Negative for musculoskeletal pain. Skin: Negative for rash, abrasions, lacerations, ecchymosis. Neurological: Negative for headaches, focal weakness or numbness. 10-point ROS otherwise negative.  ____________________________________________   PHYSICAL EXAM:  VITAL SIGNS: ED Triage Vitals  Enc Vitals Group     BP 01/30/20 0609 (!) 142/74     Pulse Rate  01/30/20 0609 90     Resp 01/30/20 0609 18     Temp 01/30/20 0609 98.2 F (36.8 C)     Temp Source 01/30/20 0609 Oral     SpO2 01/30/20 0609 95 %     Weight 01/30/20 0610 186 lb (84.4 kg)     Height 01/30/20 0610 6\' 1"  (1.854 m)     Head Circumference --      Peak Flow --      Pain Score 01/30/20 0622 0     Pain Loc --      Pain Edu? --      Excl. in GC? --      Constitutional:  Alert and oriented. Well appearing and in no acute distress. Eyes: Conjunctivae are normal. PERRL. EOMI. Head: Atraumatic. ENT:      Ears:       Nose: No congestion/rhinnorhea.      Mouth/Throat: Mucous membranes are moist.  Neck: No stridor.    Cardiovascular: Normal rate, regular rhythm. Normal S1 and S2.  Good peripheral circulation. Respiratory: Normal respiratory effort without tachypnea or retractions. Lungs CTAB. Good air entry to the bases with no decreased or absent breath sounds. Gastrointestinal: Bowel sounds 4 quadrants. Soft and nontender to palpation. No guarding or rigidity. No palpable masses. No distention. No CVA tenderness. Musculoskeletal: Full range of motion to all extremities. No gross deformities appreciated. Neurologic:  Normal speech and language. No gross focal neurologic deficits are appreciated.  Skin:  Skin is warm, dry and intact. No rash noted. Psychiatric: Mood and affect are normal. Speech and behavior are normal. Patient exhibits appropriate insight and judgement.   ____________________________________________   LABS (all labs ordered are listed, but only abnormal results are displayed)  Labs Reviewed  COMPREHENSIVE METABOLIC PANEL - Abnormal; Notable for the following components:      Result Value   Sodium 130 (*)    Chloride 94 (*)    Glucose, Bld 129 (*)    BUN 26 (*)    Creatinine, Ser 1.37 (*)    GFR calc non Af Amer 53 (*)    All other components within normal limits  LIPASE, BLOOD  CBC  URINALYSIS, COMPLETE (UACMP) WITH MICROSCOPIC   ____________________________________________  EKG   ____________________________________________  RADIOLOGY   No results found.  ____________________________________________    PROCEDURES  Procedure(s) performed:    Procedures    Medications  ondansetron (ZOFRAN) injection 4 mg (has no administration in time range)  sodium chloride 0.9 % bolus 1,000 mL (0 mLs Intravenous Stopped  01/30/20 1452)     ____________________________________________   INITIAL IMPRESSION / ASSESSMENT AND PLAN / ED COURSE  Pertinent labs & imaging results that were available during my care of the patient were reviewed by me and considered in my medical decision making (see chart for details).  Review of the [place] CSRS was performed in accordance of the [facility] prior to dispensing any controlled drugs.           Patient's diagnosis is consistent with dehydration.  Patient presented to emergency department complaining of cramping consistent with previous dehydration.  Patient states that the increase is lisinopril/HCTZ dosing.  Patient states that he has been peeing more than normal after increasing the dosing.  Patient states that he does a very physical job, has had cramping in his extremities, through his trunk.  Patient states that he is drinking "a lot of water, and the urine is running clear."  On exam, exam is reassuring.  Patient did have slightly decreased sodium but reassuring potassium levels.  Given patient's symptoms, I feel that he is slightly dehydrated has he does have a slight bump in his creatinine and BUN.  At this time I will provide the patient with a liter of fluid.  Cramping has mostly stopped at this time.  I recommended continuing his metoprolol dosing and contact his primary care about returning back to his original HCTZ dose.  Patient is agreeable with this plan.  At this time patient has no other complaints.  No indication for further work-up..  No medications at this time.  Patient will contact his primary care about adjusting his HCTZ dosing.  Patient is given ED precautions to return to the ED for any worsening or new symptoms.     ____________________________________________  FINAL CLINICAL IMPRESSION(S) / ED DIAGNOSES  Final diagnoses:  Dehydration      NEW MEDICATIONS STARTED DURING THIS VISIT:  ED Discharge Orders    None          This chart was  dictated using voice recognition software/Dragon. Despite best efforts to proofread, errors can occur which can change the meaning. Any change was purely unintentional.    Racheal Patches, PA-C 01/30/20 1504    Chesley Noon, MD 01/30/20 [phone]

## 2020-01-30 NOTE — ED Triage Notes (Signed)
Pt arrived via [facility] from home with reports of generalized cramping pt states his PCP increased his lisinopril/hctz and metoprolol and states he felt like this before and he was dehydrated.  Pt also c/o nausea and was given 4mg  Zofran with EMS. Pt has 22G IV in left hand  EMS vital signs: 130/86 p -114, 97% RA

## 2020-02-02 NOTE — Progress Notes (Signed)
Established patient visit   Patient: Richard Lamb   DOB: 02-19-1951   69 y.o. Male  MRN: [id number] Visit Date: 02/03/2020  Today's healthcare provider: [name], PA-C   Chief Complaint  Patient presents with  . ER follow up   Subjective    HPI  Follow up ER visit  Patient was seen in ER for dehydration on 01/30/2020. He was treated for weakness. Treatment for this included 1L of fluid IV. Continue Metoprolol dose and f/u with PCP to adjust Lisinopril/HCTZ dose.  He reports fair compliance with treatment. He reports this condition is Worse.  Patient reports that he has not taken lisinopril/HTCZ since his ER visit.    Hypertension, follow-up  BP Readings from Last 3 Encounters:  02/03/20 (!) 181/84  01/30/20 138/81  01/25/20 (!) 193/78   Wt Readings from Last 3 Encounters:  02/03/20 182 lb (82.6 kg)  01/30/20 186 lb (84.4 kg)  01/25/20 186 lb (84.4 kg)     He was last seen for hypertension 1 months ago.  BP at that visit was normal. Management since that visit includes continue current medications.  He reports fair compliance with treatment. Had an episode of dehydration and was seen in the ER. 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 He is exercising. He does not smoke.  Use of agents associated with hypertension: none.   Outside blood pressures are not checked. Symptoms: No chest pain No chest pressure  No palpitations No syncope  No dyspnea No orthopnea  No paroxysmal nocturnal dyspnea No lower extremity edema   Pertinent labs: Lab Results  Component Value Date   CHOL 216 (H) 06/23/2018   HDL 68 06/23/2018   LDLCALC 136 (H) 06/23/2018   TRIG 58 06/23/2018   CHOLHDL 3.2 06/23/2018   Lab Results  Component Value Date   NA 130 (L) 01/30/2020   K 3.7 01/30/2020   CREATININE 1.37 (H) 01/30/2020   GFRNONAA 53 (L) 01/30/2020   GFRAA >60 01/30/2020   GLUCOSE 129 (H) 01/30/2020       The 10-year ASCVD risk score [name], et al., 2013) is: 43.5%   ---------------------------------------------------------------------------------------------------                                                                                                    Medications: Outpatient Medications Prior to Visit  Medication Sig  . amLODipine (NORVASC) 10 MG tablet Take 1 tablet (10 mg total) by mouth daily.  Marland Kitchen atorvastatin (LIPITOR) 10 MG tablet Take 1 tablet (10 mg total) by mouth daily.  . metoprolol succinate (TOPROL-XL) 25 MG 24 hr tablet Take 1 tablet (25 mg total) by mouth daily.  Marland Kitchen lisinopril-hydrochlorothiazide (ZESTORETIC) 20-25 MG tablet Take 1 tablet by mouth daily. (Patient not taking: Reported on 02/03/2020)   No facility-administered medications prior to visit.    Review of Systems  Constitutional: Negative.   Respiratory: Negative.   Cardiovascular: Negative.   Musculoskeletal: Negative.   Neurological: Negative.  Objective    BP (!) 181/84   Pulse 80   Temp 98.3 F (36.8 C)   Wt 182 lb (82.6 kg)   BMI 24.01 kg/m    Physical Exam Constitutional:      Appearance: Normal appearance.  Cardiovascular:     Rate and Rhythm: Normal rate and regular rhythm.     Heart sounds: Normal heart sounds.  Pulmonary:     Effort: Pulmonary effort is normal.     Breath sounds: Normal breath sounds.  Skin:    General: Skin is warm and dry.  Neurological:     Mental Status: He is alert and oriented to person, place, and time. Mental status is at baseline.  Psychiatric:        Mood and Affect: Mood normal.        Behavior: Behavior normal.       No results found for any visits on 02/03/20.  Assessment & Plan    1. Essential hypertension  We will change Lisinopril-HCTZ 20-12.5 to just Lisinopril. We will recheck blood pressure at one month follow up and adjust accordingly.   - lisinopril (ZESTRIL) 20 MG tablet; Take 1 tablet (20 mg total) by mouth  daily.  Dispense: 90 tablet; Refill: 1 - Comprehensive Metabolic Panel (CMET)      I, [name], PA-C, have reviewed all documentation for this visit. The documentation on 02/03/20 for the exam, diagnosis, procedures, and orders are all accurate and complete.  The entirety of the information documented in the History of Present Illness, Review of Systems and Physical Exam were personally obtained by me. Portions of this information were initially documented by [facility] and reviewed by me for thoroughness and accuracy.     [name]  [facility] [phone] (phone) [phone] (fax)  [facility]

## 2020-02-03 ENCOUNTER — Other Ambulatory Visit: Payer: Self-pay

## 2020-02-03 ENCOUNTER — Ambulatory Visit (INDEPENDENT_AMBULATORY_CARE_PROVIDER_SITE_OTHER): Payer: PPO | Admitting: Physician Assistant

## 2020-02-03 ENCOUNTER — Encounter: Payer: Self-pay | Admitting: Physician Assistant

## 2020-02-03 VITALS — BP 181/84 | HR 80 | Temp 98.3°F | Wt 182.0 lb

## 2020-02-03 DIAGNOSIS — I10 Essential (primary) hypertension: Secondary | ICD-10-CM | POA: Diagnosis not present

## 2020-02-03 MED ORDER — LISINOPRIL 20 MG PO TABS: 20.0000 mg | ORAL_TABLET | Freq: Every day | ORAL | 1 refills | Status: DC

## 2020-02-08 ENCOUNTER — Encounter: Payer: Self-pay | Admitting: Physician Assistant

## 2020-04-24 NOTE — Progress Notes (Signed)
Established patient visit   Patient: Richard Lamb   DOB: 30-Dec-1950   69 y.o. Male  MRN: [id number] Visit Date: 04/25/2020  Today's healthcare provider: [name], PA-C   Chief Complaint  Patient presents with  . Hypertension  I,[name],acting as a scribe for [employer], PA-C.,have documented all relevant documentation on the behalf of [name], PA-C,as directed by  [name], PA-C while in the presence of [name], PA-C.  Subjective    HPI  Hypertension, follow-up  BP Readings from Last 3 Encounters:  04/25/20 (!) 142/92  02/03/20 (!) 181/84  01/30/20 138/81   Wt Readings from Last 3 Encounters:  04/25/20 182 lb 4.8 oz (82.7 kg)  02/03/20 182 lb (82.6 kg)  01/30/20 186 lb (84.4 kg)     He was last seen for hypertension 2 months ago.  BP at that visit was 181/84. Management since that visit includes changed Lisinopril-HCTZ 20-12.5 to Lisinopril 20 MG.  He reports good compliance with treatment. He is not having side effects.  He is following a Regular diet. He is exercising. He does not smoke.  Use of agents associated with hypertension: none.   Outside blood pressures are not being checked. Symptoms: No chest pain No chest pressure  No palpitations No syncope  No dyspnea No orthopnea  No paroxysmal nocturnal dyspnea No lower extremity edema   Pertinent labs: Lab Results  Component Value Date   CHOL 216 (H) 06/23/2018   HDL 68 06/23/2018   LDLCALC 136 (H) 06/23/2018   TRIG 58 06/23/2018   CHOLHDL 3.2 06/23/2018   Lab Results  Component Value Date   NA 130 (L) 01/30/2020   K 3.7 01/30/2020   CREATININE 1.37 (H) 01/30/2020   GFRNONAA 53 (L) 01/30/2020   GFRAA >60 01/30/2020   GLUCOSE 129 (H) 01/30/2020     The 10-year ASCVD risk score [name], et al., 2013) is: 31.1%   ---------------------------------------------------------------------------------------------------  Lipid/Cholesterol,  Follow-up  Last lipid panel Other pertinent labs  Lab Results  Component Value Date   CHOL 216 (H) 06/23/2018   HDL 68 06/23/2018   LDLCALC 136 (H) 06/23/2018   TRIG 58 06/23/2018   CHOLHDL 3.2 06/23/2018   Lab Results  Component Value Date   ALT 27 01/30/2020   AST 38 01/30/2020   PLT 222 01/30/2020     He was last seen for this 3 months ago.  Management since that visit includes continue statin.  He reports good compliance with treatment. He is not having side effects.   Symptoms: No chest pain No chest pressure/discomfort  No dyspnea No lower extremity edema  No numbness or tingling of extremity No orthopnea  No palpitations No paroxysmal nocturnal dyspnea  No speech difficulty No syncope   Current diet: well balanced Current exercise: aerobics  The 10-year ASCVD risk score [name], et al., 2013) is: 31.1%  ---------------------------------------------------------------------------------------------------      Medications: Outpatient Medications Prior to Visit  Medication Sig  . amLODipine (NORVASC) 10 MG tablet Take 1 tablet (10 mg total) by mouth daily.  Marland Kitchen atorvastatin (LIPITOR) 10 MG tablet Take 1 tablet (10 mg total) by mouth daily.  Marland Kitchen lisinopril (ZESTRIL) 20 MG tablet Take 1 tablet (20 mg total) by mouth daily.  . metoprolol succinate (TOPROL-XL) 25 MG 24 hr tablet Take 1 tablet (25 mg total) by mouth daily.   No facility-administered medications prior to visit.    Review  of Systems  Constitutional: Negative.   Respiratory: Negative.   Cardiovascular: Negative.   Gastrointestinal: Negative.   Hematological: Negative.       Objective    BP (!) 142/92 (BP Location: Left Arm, Patient Position: Sitting, Cuff Size: Large)   Pulse 91   Temp 98.6 F (37 C) (Oral)   Wt 182 lb 4.8 oz (82.7 kg)   SpO2 100%   BMI 24.05 kg/m    Physical Exam Constitutional:      Appearance: Normal appearance. He is obese.  Cardiovascular:     Rate and  Rhythm: Normal rate and regular rhythm.     Heart sounds: Normal heart sounds.  Pulmonary:     Effort: Pulmonary effort is normal.     Breath sounds: Normal breath sounds.  Skin:    General: Skin is warm and dry.  Neurological:     General: No focal deficit present.     Mental Status: He is alert and oriented to person, place, and time.  Psychiatric:        Mood and Affect: Mood normal.        Behavior: Behavior normal.       No results found for any visits on 04/25/20.  Assessment & Plan    1. Essential hypertension  Increase lisinopril from 20 to 40 and continue other medications as prescribed. Follow up 3 months.   - lisinopril (ZESTRIL) 40 MG tablet; Take 1 tablet (40 mg total) by mouth daily.  Dispense: 90 tablet; Refill: 1  2. Hyperlipidemia, unspecified hyperlipidemia type  Continue statin.  3. Tobacco abuse     No follow-ups on file.      ITrey Sailors, PA-C, have reviewed all documentation for this visit. The documentation on 04/25/20 for the exam, diagnosis, procedures, and orders are all accurate and complete.    [name]  [name] [phone] (phone) [phone] (fax)  [name]

## 2020-04-25 ENCOUNTER — Other Ambulatory Visit: Payer: Self-pay

## 2020-04-25 ENCOUNTER — Ambulatory Visit (INDEPENDENT_AMBULATORY_CARE_PROVIDER_SITE_OTHER): Payer: PPO | Admitting: Physician Assistant

## 2020-04-25 ENCOUNTER — Encounter: Payer: Self-pay | Admitting: Physician Assistant

## 2020-04-25 VITALS — BP 142/92 | HR 91 | Temp 98.6°F | Wt 182.3 lb

## 2020-04-25 DIAGNOSIS — I10 Essential (primary) hypertension: Secondary | ICD-10-CM | POA: Diagnosis not present

## 2020-04-25 DIAGNOSIS — Z23 Encounter for immunization: Secondary | ICD-10-CM

## 2020-04-25 DIAGNOSIS — Z720 Tobacco use: Secondary | ICD-10-CM | POA: Diagnosis not present

## 2020-04-25 DIAGNOSIS — E785 Hyperlipidemia, unspecified: Secondary | ICD-10-CM | POA: Diagnosis not present

## 2020-04-25 MED ORDER — LISINOPRIL 40 MG PO TABS: 40.0000 mg | ORAL_TABLET | Freq: Every day | ORAL | 1 refills | Status: DC

## 2020-06-08 NOTE — Progress Notes (Signed)
Subjective:   Richard Lamb is a 70 y.o. male who presents for Medicare Annual/Subsequent preventive examination.  I connected with Richard Lamb today by telephone and verified that I am speaking with the correct person using two identifiers. Location patient: home Location provider: work Persons participating in the virtual visit: patient, provider.   I discussed the limitations, risks, security and privacy concerns of performing an evaluation and management service by telephone and the availability of in person appointments. I also discussed with the patient that there may be a patient responsible charge related to this service. The patient expressed understanding and verbally consented to this telephonic visit.    Interactive audio and video telecommunications were attempted between this provider and patient, however failed, due to patient having technical difficulties OR patient did not have access to video capability.  We continued and completed visit with audio only.   Review of Systems    N/A  Cardiac Risk Factors include: advanced age (>34men, >57 women);dyslipidemia;hypertension;male gender;smoking/ tobacco exposure;sedentary lifestyle     Objective:    There were no vitals filed for this visit. There is no height or weight on file to calculate BMI.  Advanced Directives 06/12/2020 01/30/2020 06/30/2018 06/16/2018  Does Patient Have a Medical Advance Directive? No No No No  Would patient like information on creating a medical advance directive? No - Patient declined No - Patient declined No - Patient declined -    Current Medications (verified) Outpatient Encounter Medications as of 06/12/2020  Medication Sig  . amLODipine (NORVASC) 10 MG tablet Take 1 tablet (10 mg total) by mouth daily.  Marland Kitchen atorvastatin (LIPITOR) 10 MG tablet Take 1 tablet (10 mg total) by mouth daily.  Marland Kitchen lisinopril (ZESTRIL) 40 MG tablet Take 1 tablet (40 mg total) by mouth daily.  . metoprolol  succinate (TOPROL-XL) 25 MG 24 hr tablet Take 1 tablet (25 mg total) by mouth daily.   No facility-administered encounter medications on file as of 06/12/2020.    Allergies (verified) Sulfa antibiotics   History: Past Medical History:  Diagnosis Date  . Anxiety   . GERD (gastroesophageal reflux disease)   . Hyperlipidemia   . Hypertension    Past Surgical History:  Procedure Laterality Date  . CYSTOSCOPY WITH LITHOLAPAXY N/A 06/30/2018   Procedure: CYSTOSCOPY WITH LITHOLAPAXY;  Surgeon: Riki Altes, MD;  Location: [facility] ORS;  Service: Urology;  Laterality: N/A;  . PROSTATE SURGERY     Dr.Wolfe   Family History  Problem Relation Age of Onset  . Hypertension Mother   . Hypertension Father   . Stroke Brother   . Hyperlipidemia Brother    Social History   Socioeconomic History  . Marital status: Single    Spouse name: Not on file  . Number of children: 3  . Years of education: Not on file  . Highest education level: High school graduate  Occupational History    Comment: full time  Tobacco Use  . Smoking status: Current Every Day Smoker    Packs/day: 0.50    Types: Cigarettes  . Smokeless tobacco: Never Used  . Tobacco comment: < than half pack a day  Vaping Use  . Vaping Use: Never used  Substance and Sexual Activity  . Alcohol use: Yes    Alcohol/week: 7.0 standard drinks    Types: 7 Cans of beer per week    Comment: 1 beer a day  . Drug use: Yes    Types: Marijuana  . Sexual activity: Yes  Birth control/protection: None  Other Topics Concern  . Not on file  Social History Narrative  . Not on file   Social Determinants of Health   Financial Resource Strain: Low Risk   . Difficulty of Paying Living Expenses: Not hard at all  Food Insecurity: No Food Insecurity  . Worried About Programme researcher, broadcasting/film/video in the Last Year: Never true  . Ran Out of Food in the Last Year: Never true  Transportation Needs: No Transportation Needs  . Lack of Transportation  (Medical): No  . Lack of Transportation (Non-Medical): No  Physical Activity: Inactive  . Days of Exercise per Week: 0 days  . Minutes of Exercise per Session: 0 min  Stress: No Stress Concern Present  . Feeling of Stress : Not at all  Social Connections: Moderately Isolated  . Frequency of Communication with Friends and Family: Three times a week  . Frequency of Social Gatherings with Friends and Family: More than three times a week  . Attends Religious Services: More than 4 times per year  . Active Member of Clubs or Organizations: No  . Attends Banker Meetings: Never  . Marital Status: Never married    Tobacco Counseling Ready to quit: Yes Counseling given: No Comment: < than half pack a day   Clinical Intake:  Pre-visit preparation completed: Yes  Pain : No/denies pain     Nutritional Risks: None Diabetes: No  How often do you need to have someone help you when you read instructions, pamphlets, or other written materials from your doctor or pharmacy?: 1 - Never  Diabetic? No  Interpreter Needed?: No  Information entered by :: [facility], LPN   Activities of Daily Living In your present state of health, do you have any difficulty performing the following activities: 06/12/2020 04/25/2020  Hearing? N N  Vision? N N  Difficulty concentrating or making decisions? N N  Walking or climbing stairs? N N  Dressing or bathing? N N  Doing errands, shopping? N N  Preparing Food and eating ? N -  Using the Toilet? N -  In the past six months, have you accidently leaked urine? N -  Do you have problems with loss of bowel control? N -  Managing your Medications? N -  Managing your Finances? N -  Housekeeping or managing your Housekeeping? N -  Some recent data might be hidden    Patient Care Team: [name] as PCP - General (Physician Assistant)  Indicate any recent Medical Services you may have received from other than [facility] providers in  the past year (date may be approximate).     Assessment:   This is a routine wellness examination for [employer].  Hearing/Vision screen No exam data present  Dietary issues and exercise activities discussed: Current Exercise Habits: The patient does not participate in regular exercise at present, Exercise limited by: None identified  Goals    . Quit Smoking     Recommend to continue efforts to reduce smoking habits until no longer smoking.       Depression Screen PHQ 2/9 Scores 06/12/2020 04/25/2020 01/25/2020 12/28/2018 06/23/2018  PHQ - 2 Score 0 0 0 0 0  PHQ- 9 Score - 0 - 0 0    Fall Risk Fall Risk  06/12/2020 04/25/2020 07/20/2019 06/23/2018  Falls in the past year? 0 0 0 0  Number falls in past yr: 0 0 0 0  Injury with Fall? 0 0 0 0  Risk  for fall due to : - No Fall Risks No Fall Risks -  Follow up - Falls evaluation completed Falls evaluation completed -    FALL RISK PREVENTION PERTAINING TO THE HOME:  Any stairs in or around the home? No  If so, are there any without handrails? No  Home free of loose throw rugs in walkways, pet beds, electrical cords, etc? Yes  Adequate lighting in your home to reduce risk of falls? Yes   ASSISTIVE DEVICES UTILIZED TO PREVENT FALLS:  Life alert? No  Use of a cane, walker or w/c? No  Grab bars in the bathroom? No  Shower chair or bench in shower? No  Elevated toilet seat or a handicapped toilet? No    Cognitive Function: Declined today.          Immunizations Immunization History  Administered Date(s) Administered  . Fluad Quad(high Dose 65+) 03/30/2019, 03/16/2020  . Influenza,inj,Quad PF,6+ Mos 04/07/2018  . PFIZER(Purple Top)SARS-COV-2 Vaccination 07/03/2019, 07/27/2019, 04/25/2020  . Pneumococcal Conjugate-13 12/28/2018  . Pneumococcal Polysaccharide-23 01/25/2020    TDAP status: Due, Education has been provided regarding the importance of this vaccine. Advised may receive this vaccine at local pharmacy or Health Dept.  Aware to provide a copy of the vaccination record if obtained from local pharmacy or Health Dept. Verbalized acceptance and understanding.  Flu Vaccine status: Up to date  Pneumococcal vaccine status: Up to date  Covid-19 vaccine status: Completed vaccines  Qualifies for Shingles Vaccine? Yes   Zostavax completed No   Shingrix Completed?: No.    Education has been provided regarding the importance of this vaccine. Patient has been advised to call insurance company to determine out of pocket expense if they have not yet received this vaccine. Advised may also receive vaccine at local pharmacy or Health Dept. Verbalized acceptance and understanding.  Screening Tests Health Maintenance  Topic Date Due  . Hepatitis C Screening  Never done  . COLONOSCOPY (Pts 45-89yrs Insurance coverage will need to be confirmed)  01/24/2021 (Originally 04/19/1998)  . TETANUS/TDAP  01/25/2021 (Originally 04/19/1972)  . INFLUENZA VACCINE  Completed  . COVID-19 Vaccine  Completed  . PNA vac Low Risk Adult  Completed    Health Maintenance  Health Maintenance Due  Topic Date Due  . Hepatitis C Screening  Never done    Colorectal cancer screening: Currently due, declined a colonoscopy referral or cologuard order at this time.   Lung Cancer Screening: (Low Dose CT Chest recommended if Age 61-80 years, 30 pack-year currently smoking OR have quit w/in 15years.) does qualify however declines screening at this time.   Additional Screening:  Hepatitis C Screening: does qualify and would like this order  added to next in office BW orders.  Vision Screening: Recommended annual ophthalmology exams for early detection of glaucoma and other disorders of the eye. Is the patient up to date with their annual eye exam? No Who is the provider or what is the name of the office in which the patient attends annual eye exams? None, pt plans to call and schedule an eye exam this year with a new provider. If pt is not  established with a provider, would they like to be referred to a provider to establish care? No .   Dental Screening: Recommended annual dental exams for proper oral hygiene  Community Resource Referral / Chronic Care Management: CRR required this visit?  No   CCM required this visit?  No      Plan:  I have personally reviewed and noted the following in the patient's chart:   . Medical and social history . Use of alcohol, tobacco or illicit drugs  . Current medications and supplements . Functional ability and status . Nutritional status . Physical activity . Advanced directives . List of other physicians . Hospitalizations, surgeries, and ER visits in previous 12 months . Vitals . Screenings to include cognitive, depression, and falls . Referrals and appointments  In addition, I have reviewed and discussed with patient certain preventive protocols, quality metrics, and best practice recommendations. A written personalized care plan for preventive services as well as general preventive health recommendations were provided to patient.     Emillio Ngo [place], [place]   [id number]/17/2022   Nurse Notes: Pt would like the Hep C lab order added to next in office BW orders. Pt declined a colonoscopy referral or cologuard order at this time.

## 2020-06-12 ENCOUNTER — Ambulatory Visit (INDEPENDENT_AMBULATORY_CARE_PROVIDER_SITE_OTHER): Payer: PPO

## 2020-06-12 ENCOUNTER — Other Ambulatory Visit: Payer: Self-pay

## 2020-06-12 DIAGNOSIS — Z0000 Encounter for general adult medical examination without abnormal findings: Secondary | ICD-10-CM | POA: Diagnosis not present

## 2020-06-12 NOTE — Patient Instructions (Signed)
Mr. Richard Lamb , Thank you for taking time to come for your Medicare Wellness Visit. I appreciate your ongoing commitment to your health goals. Please review the following plan we discussed and let me know if I can assist you in the future.   Screening recommendations/referrals: Colonoscopy: Currently due, declined colonoscopy referral or cologuard order at this time.  Recommended yearly ophthalmology/optometry visit for glaucoma screening and checkup Recommended yearly dental visit for hygiene and checkup  Vaccinations: Influenza vaccine: Done 03/16/20 Pneumococcal vaccine: Completed series Tdap vaccine: Currently due, declined receiving.  Shingles vaccine: Shingrix discussed. Please contact your pharmacy for coverage information.     Advanced directives: Advance directive discussed with you today. Even though you declined this today please call our office should you change your mind and we can give you the proper paperwork for you to fill out.  Conditions/risks identified: Smoking cessation discussed today.   Next appointment: 07/25/20 @ 8:00 AM with Richard Lamb   Preventive Care 65 Years and Older, Male Preventive care refers to lifestyle choices and visits with your health care provider that can promote health and wellness. What does preventive care include?  A yearly physical exam. This is also called an annual well check.  Dental exams once or twice a year.  Routine eye exams. Ask your health care provider how often you should have your eyes checked.  Personal lifestyle choices, including:  Daily care of your teeth and gums.  Regular physical activity.  Eating a healthy diet.  Avoiding tobacco and drug use.  Limiting alcohol use.  Practicing safe sex.  Taking low doses of aspirin every day.  Taking vitamin and mineral supplements as recommended by your health care provider. What happens during an annual well check? The services and screenings done by your health  care provider during your annual well check will depend on your age, overall health, lifestyle risk factors, and family history of disease. Counseling  Your health care provider may ask you questions about your:  Alcohol use.  Tobacco use.  Drug use.  Emotional well-being.  Home and relationship well-being.  Sexual activity.  Eating habits.  History of falls.  Memory and ability to understand (cognition).  Work and work Astronomer. Screening  You may have the following tests or measurements:  Height, weight, and BMI.  Blood pressure.  Lipid and cholesterol levels. These may be checked every 5 years, or more frequently if you are over 60 years old.  Skin check.  Lung cancer screening. You may have this screening every year starting at age 24 if you have a 30-pack-year history of smoking and currently smoke or have quit within the past 15 years.  Fecal occult blood test (FOBT) of the stool. You may have this test every year starting at age 41.  Flexible sigmoidoscopy or colonoscopy. You may have a sigmoidoscopy every 5 years or a colonoscopy every 10 years starting at age 30.  Prostate cancer screening. Recommendations will vary depending on your family history and other risks.  Hepatitis C blood test.  Hepatitis B blood test.  Sexually transmitted disease (STD) testing.  Diabetes screening. This is done by checking your blood sugar (glucose) after you have not eaten for a while (fasting). You may have this done every 1-3 years.  Abdominal aortic aneurysm (AAA) screening. You may need this if you are a current or former smoker.  Osteoporosis. You may be screened starting at age 66 if you are at high risk. Talk with your health care provider  about your test results, treatment options, and if necessary, the need for more tests. Vaccines  Your health care provider may recommend certain vaccines, such as:  Influenza vaccine. This is recommended every  year.  Tetanus, diphtheria, and acellular pertussis (Tdap, Td) vaccine. You may need a Td booster every 10 years.  Zoster vaccine. You may need this after age 58.  Pneumococcal 13-valent conjugate (PCV13) vaccine. One dose is recommended after age 75.  Pneumococcal polysaccharide (PPSV23) vaccine. One dose is recommended after age 67. Talk to your health care provider about which screenings and vaccines you need and how often you need them. This information is not intended to replace advice given to you by your health care provider. Make sure you discuss any questions you have with your health care provider. Document Released: 06/09/2015 Document Revised: 01/31/2016 Document Reviewed: 03/14/2015 Elsevier Interactive Patient Education  2017 [employer] Prevention in the Home Falls can cause injuries. They can happen to people of all ages. There are many things you can do to make your home safe and to help prevent falls. What can I do on the outside of my home?  Regularly fix the edges of walkways and driveways and fix any cracks.  Remove anything that might make you trip as you walk through a door, such as a raised step or threshold.  Trim any bushes or trees on the path to your home.  Use bright outdoor lighting.  Clear any walking paths of anything that might make someone trip, such as rocks or tools.  Regularly check to see if handrails are loose or broken. Make sure that both sides of any steps have handrails.  Any raised decks and porches should have guardrails on the edges.  Have any leaves, snow, or ice cleared regularly.  Use sand or salt on walking paths during winter.  Clean up any spills in your garage right away. This includes oil or grease spills. What can I do in the bathroom?  Use night lights.  Install grab bars by the toilet and in the tub and shower. Do not use towel bars as grab bars.  Use non-skid mats or decals in the tub or shower.  If you  need to sit down in the shower, use a plastic, non-slip stool.  Keep the floor dry. Clean up any water that spills on the floor as soon as it happens.  Remove soap buildup in the tub or shower regularly.  Attach bath mats securely with double-sided non-slip rug tape.  Do not have throw rugs and other things on the floor that can make you trip. What can I do in the bedroom?  Use night lights.  Make sure that you have a light by your bed that is easy to reach.  Do not use any sheets or blankets that are too big for your bed. They should not hang down onto the floor.  Have a firm chair that has side arms. You can use this for support while you get dressed.  Do not have throw rugs and other things on the floor that can make you trip. What can I do in the kitchen?  Clean up any spills right away.  Avoid walking on wet floors.  Keep items that you use a lot in easy-to-reach places.  If you need to reach something above you, use a strong step stool that has a grab bar.  Keep electrical cords out of the way.  Do not use floor polish or  wax that makes floors slippery. If you must use wax, use non-skid floor wax.  Do not have throw rugs and other things on the floor that can make you trip. What can I do with my stairs?  Do not leave any items on the stairs.  Make sure that there are handrails on both sides of the stairs and use them. Fix handrails that are broken or loose. Make sure that handrails are as long as the stairways.  Check any carpeting to make sure that it is firmly attached to the stairs. Fix any carpet that is loose or worn.  Avoid having throw rugs at the top or bottom of the stairs. If you do have throw rugs, attach them to the floor with carpet tape.  Make sure that you have a light switch at the top of the stairs and the bottom of the stairs. If you do not have them, ask someone to add them for you. What else can I do to help prevent falls?  Wear shoes  that:  Do not have high heels.  Have rubber bottoms.  Are comfortable and fit you well.  Are closed at the toe. Do not wear sandals.  If you use a stepladder:  Make sure that it is fully opened. Do not climb a closed stepladder.  Make sure that both sides of the stepladder are locked into place.  Ask someone to hold it for you, if possible.  Clearly mark and make sure that you can see:  Any grab bars or handrails.  First and last steps.  Where the edge of each step is.  Use tools that help you move around (mobility aids) if they are needed. These include:  Canes.  Walkers.  Scooters.  Crutches.  Turn on the lights when you go into a dark area. Replace any light bulbs as soon as they burn out.  Set up your furniture so you have a clear path. Avoid moving your furniture around.  If any of your floors are uneven, fix them.  If there are any pets around you, be aware of where they are.  Review your medicines with your doctor. Some medicines can make you feel dizzy. This can increase your chance of falling. Ask your doctor what other things that you can do to help prevent falls. This information is not intended to replace advice given to you by your health care provider. Make sure you discuss any questions you have with your health care provider. Document Released: 03/09/2009 Document Revised: 10/19/2015 Document Reviewed: 06/17/2014 Elsevier Interactive Patient Education  2017 [employer].

## 2020-07-10 ENCOUNTER — Other Ambulatory Visit: Payer: Self-pay | Admitting: Physician Assistant

## 2020-07-10 DIAGNOSIS — I10 Essential (primary) hypertension: Secondary | ICD-10-CM

## 2020-07-13 ENCOUNTER — Other Ambulatory Visit: Payer: Self-pay | Admitting: Physician Assistant

## 2020-07-13 DIAGNOSIS — I10 Essential (primary) hypertension: Secondary | ICD-10-CM

## 2020-07-24 NOTE — Progress Notes (Signed)
Established patient visit   Patient: Richard Lamb   DOB: [birth year]   70 y.o. Male  MRN: [id number] Visit Date: 07/25/2020  Today's healthcare provider: Trinna Post, PA-C   Chief Complaint  Patient presents with  . Hypertension  I,Adriana M Pollak,acting as a scribe for [employer], PA-C.,have documented all relevant documentation on the behalf of Trinna Post, PA-C,as directed by  Trinna Post, PA-C while in the presence of Trinna Post, PA-C.  Subjective    HPI  Hypertension, follow-up  BP Readings from Last 3 Encounters:  07/25/20 (!) 141/79  04/25/20 (!) 142/92  02/03/20 (!) 181/84   Wt Readings from Last 3 Encounters:  07/25/20 181 lb 11.2 oz (82.4 kg)  04/25/20 182 lb 4.8 oz (82.7 kg)  02/03/20 182 lb (82.6 kg)     He was last seen for hypertension 3 months ago.  BP at that visit was 142/92. Management since that visit includes increased lisinopril from 20 mg to 40 mg. He is also amlodipine 10 mg daily. He also continues metoprolol succinate 25 mg daily.   He reports good compliance with treatment. He is not having side effects.  He is following a Regular, Low Sodium diet. He is exercising. He does smoke.  Use of agents associated with hypertension: none.   Outside blood pressures are not being checked. Symptoms: No chest pain No chest pressure  No palpitations No syncope  No dyspnea No orthopnea  No paroxysmal nocturnal dyspnea No lower extremity edema   Pertinent labs: Lab Results  Component Value Date   CHOL 216 (H) 06/23/2018   HDL 68 06/23/2018   LDLCALC 136 (H) 06/23/2018   TRIG 58 06/23/2018   CHOLHDL 3.2 06/23/2018   Lab Results  Component Value Date   NA 141 07/25/2020   K 4.9 07/25/2020   CREATININE 1.30 (H) 07/25/2020   GFRNONAA 53 (L) 01/30/2020   GFRAA >60 01/30/2020   GLUCOSE 112 (H) 07/25/2020     The 10-year ASCVD risk score Mikey Bussing DC Jr., et al., 2013) is: 30.7%    ---------------------------------------------------------------------------------------------------  Lipid/Cholesterol, Follow-up  Last lipid panel Other pertinent labs  Lab Results  Component Value Date   CHOL 216 (H) 06/23/2018   HDL 68 06/23/2018   LDLCALC 136 (H) 06/23/2018   TRIG 58 06/23/2018   CHOLHDL 3.2 06/23/2018   Lab Results  Component Value Date   ALT 23 07/25/2020   AST 21 07/25/2020   PLT 222 01/30/2020     He was last seen for this 6 months ago.  Management since that visit includes continue statin.  He reports excellent compliance with treatment. He is not having side effects.   Symptoms: No chest pain No chest pressure/discomfort  No dyspnea No lower extremity edema  No numbness or tingling of extremity No orthopnea  No palpitations No paroxysmal nocturnal dyspnea  No speech difficulty No syncope   Current diet: well balanced Current exercise: aerobics and bicycling  The 10-year ASCVD risk score Mikey Bussing DC Jr., et al., 2013) is: 30.7%  ---------------------------------------------------------------------------------------------------     Medications: Outpatient Medications Prior to Visit  Medication Sig  . [DISCONTINUED] amLODipine (NORVASC) 10 MG tablet Take 1 tablet (10 mg total) by mouth daily.  . [DISCONTINUED] atorvastatin (LIPITOR) 10 MG tablet Take 1 tablet (10 mg total) by mouth daily.  . [DISCONTINUED] lisinopril (ZESTRIL) 40 MG tablet Take 1 tablet (40 mg total) by mouth daily.  . [DISCONTINUED] metoprolol succinate (TOPROL-XL)  25 MG 24 hr tablet Take 1 tablet (25 mg total) by mouth daily.   No facility-administered medications prior to visit.    Review of Systems  Constitutional: Negative.   Respiratory: Negative.   Hematological: Negative.        Objective    BP (!) 141/79 (BP Location: Left Arm, Patient Position: Sitting, Cuff Size: Normal)   Pulse (!) 101   Temp 98.1 F (36.7 C) (Oral)   Wt 181 lb 11.2 oz (82.4 kg)    SpO2 99%   BMI 23.97 kg/m     Physical Exam Constitutional:      Appearance: Normal appearance.  Cardiovascular:     Rate and Rhythm: Normal rate and regular rhythm.     Pulses: Normal pulses.     Heart sounds: Normal heart sounds.  Pulmonary:     Effort: Pulmonary effort is normal.     Breath sounds: Normal breath sounds.  Skin:    General: Skin is warm and dry.  Neurological:     General: No focal deficit present.     Mental Status: He is alert and oriented to person, place, and time.  Psychiatric:        Mood and Affect: Mood normal.        Behavior: Behavior normal.       Results for orders placed or performed in visit on 07/25/20  Comprehensive Metabolic Panel (CMET)  Result Value Ref Range   Glucose 112 (H) 65 - 99 mg/dL   BUN 14 8 - 27 mg/dL   Creatinine, Ser 1.30 (H) 0.76 - 1.27 mg/dL   eGFR 60 >59 mL/min/1.73   BUN/Creatinine Ratio 11 10 - 24   Sodium 141 134 - 144 mmol/L   Potassium 4.9 3.5 - 5.2 mmol/L   Chloride 101 96 - 106 mmol/L   CO2 22 20 - 29 mmol/L   Calcium 10.3 (H) 8.6 - 10.2 mg/dL   Total Protein 7.1 6.0 - 8.5 g/dL   Albumin 4.7 3.8 - 4.8 g/dL   Globulin, Total 2.4 1.5 - 4.5 g/dL   Albumin/Globulin Ratio 2.0 1.2 - 2.2   Bilirubin Total 0.5 0.0 - 1.2 mg/dL   Alkaline Phosphatase 57 44 - 121 IU/L   AST 21 0 - 40 IU/L   ALT 23 0 - 44 IU/L    Assessment & Plan    1. Essential hypertension  - metoprolol succinate (TOPROL-XL) 25 MG 24 hr tablet; Take 1 tablet (25 mg total) by mouth daily.  Dispense: 90 tablet; Refill: 1 - lisinopril (ZESTRIL) 40 MG tablet; Take 1 tablet (40 mg total) by mouth daily.  Dispense: 90 tablet; Refill: 1 - Comprehensive Metabolic Panel (CMET)  2. Hyperlipidemia, unspecified hyperlipidemia type  - atorvastatin (LIPITOR) 10 MG tablet; Take 1 tablet (10 mg total) by mouth daily.  Dispense: 90 tablet; Refill: 1  3. Hypertension, unspecified type  - amLODipine (NORVASC) 10 MG tablet; Take 1 tablet (10 mg total) by  mouth daily.  Dispense: 90 tablet; Refill: 1   Return in about 6 months (around 01/25/2021) for follow up chronic .      ITrinna Post, PA-C, have reviewed all documentation for this visit. The documentation on 08/01/20 for the exam, diagnosis, procedures, and orders are all accurate and complete.  The entirety of the information documented in the History of Present Illness, Review of Systems and Physical Exam were personally obtained by me. Portions of this information were initially documented by [facility] and reviewed  by me for thoroughness and accuracy.     Paulene Floor  [facility] [phone] (phone) [phone] (fax)  [place]

## 2020-07-25 ENCOUNTER — Ambulatory Visit (INDEPENDENT_AMBULATORY_CARE_PROVIDER_SITE_OTHER): Payer: PPO | Admitting: Physician Assistant

## 2020-07-25 ENCOUNTER — Other Ambulatory Visit: Payer: Self-pay

## 2020-07-25 ENCOUNTER — Encounter: Payer: Self-pay | Admitting: Physician Assistant

## 2020-07-25 VITALS — BP 141/79 | HR 101 | Temp 98.1°F | Wt 181.7 lb

## 2020-07-25 DIAGNOSIS — I10 Essential (primary) hypertension: Secondary | ICD-10-CM

## 2020-07-25 DIAGNOSIS — E785 Hyperlipidemia, unspecified: Secondary | ICD-10-CM | POA: Diagnosis not present

## 2020-07-25 MED ORDER — AMLODIPINE BESYLATE 10 MG PO TABS: 10.0000 mg | ORAL_TABLET | Freq: Every day | ORAL | 1 refills | Status: DC

## 2020-07-25 MED ORDER — METOPROLOL SUCCINATE ER 25 MG PO TB24: 25.0000 mg | ORAL_TABLET | Freq: Every day | ORAL | 1 refills | Status: DC

## 2020-07-25 MED ORDER — LISINOPRIL 40 MG PO TABS
40.0000 mg | ORAL_TABLET | Freq: Every day | ORAL | 1 refills | Status: DC
Start: 2020-07-25 — End: 2021-04-10

## 2020-07-25 MED ORDER — ATORVASTATIN CALCIUM 10 MG PO TABS: 10.0000 mg | ORAL_TABLET | Freq: Every day | ORAL | 1 refills | Status: DC

## 2020-07-26 ENCOUNTER — Telehealth: Payer: Self-pay

## 2020-07-26 LAB — COMPREHENSIVE METABOLIC PANEL
ALT: 23 IU/L (ref 0–44)
AST: 21 IU/L (ref 0–40)
Albumin/Globulin Ratio: 2 (ref 1.2–2.2)
Albumin: 4.7 g/dL (ref 3.8–4.8)
Alkaline Phosphatase: 57 IU/L (ref 44–121)
BUN/Creatinine Ratio: 11 (ref 10–24)
BUN: 14 mg/dL (ref 8–27)
Bilirubin Total: 0.5 mg/dL (ref 0.0–1.2)
CO2: 22 mmol/L (ref 20–29)
Calcium: 10.3 mg/dL — ABNORMAL HIGH (ref 8.6–10.2)
Chloride: 101 mmol/L (ref 96–106)
Creatinine, Ser: 1.3 mg/dL — ABNORMAL HIGH (ref 0.76–1.27)
Globulin, Total: 2.4 g/dL (ref 1.5–4.5)
Glucose: 112 mg/dL — ABNORMAL HIGH (ref 65–99)
Potassium: 4.9 mmol/L (ref 3.5–5.2)
Sodium: 141 mmol/L (ref 134–144)
Total Protein: 7.1 g/dL (ref 6.0–8.5)
eGFR: 60 mL/min/{1.73_m2} (ref >59)

## 2020-07-26 NOTE — Telephone Encounter (Signed)
-----   Message from [name], [place] sent at 07/26/2020  9:06 AM EST ----- Kidney function has improved, still slightly below normal but nearly in the normal range.

## 2020-07-26 NOTE — Telephone Encounter (Signed)
Patient was advised and verbalize understanding.

## 2020-07-29 IMAGING — DX DG CHEST 1V PORT
2 series · 2 of 2 positions shown · non-contrast
Comparison: None.

CLINICAL DATA: Cough

EXAM:
PORTABLE CHEST 1 VIEW

[chest ap (1 of 2)]
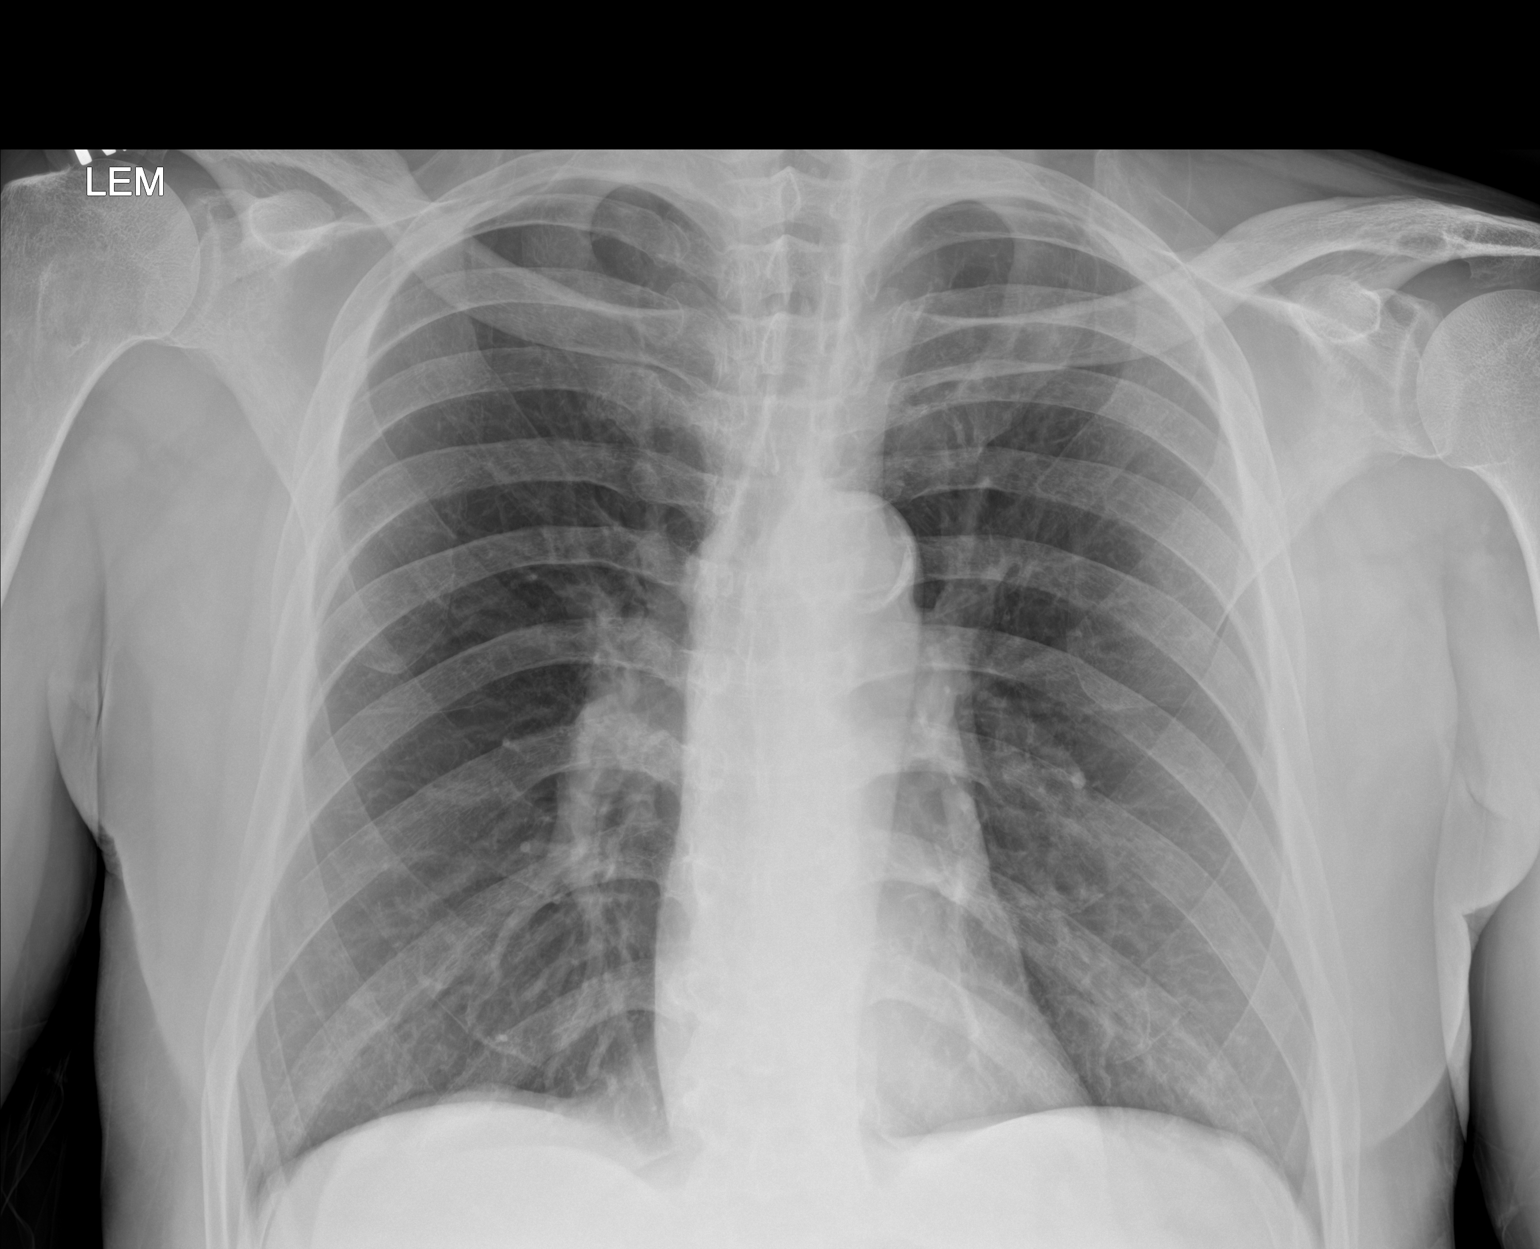

[chest ap (2 of 2)]
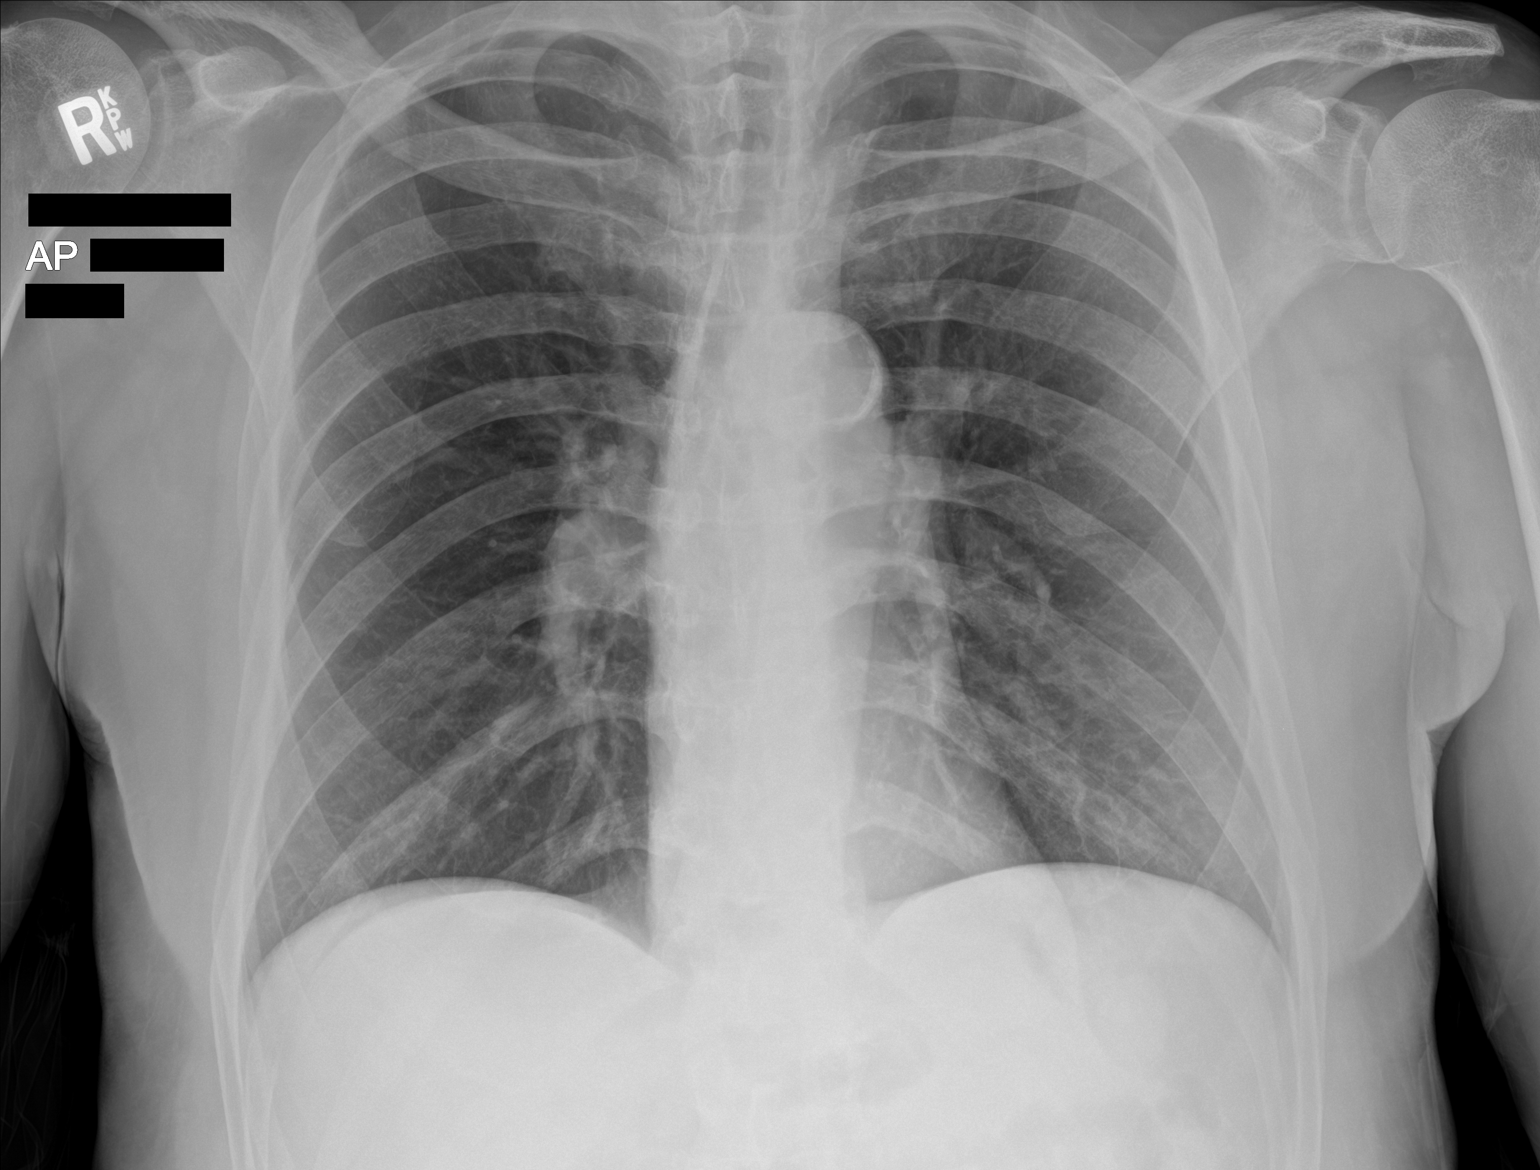

[2 of 2 positions shown; findings below may reference images not displayed]

FINDINGS: Normal heart size. Atherosclerotic aortic arch. Otherwise normal
mediastinal contour. No pneumothorax. No pleural effusion. Lungs
appear clear, with no acute consolidative airspace disease and no
pulmonary edema.
IMPRESSION: No active disease.

## 2020-08-04 ENCOUNTER — Telehealth: Payer: Self-pay | Admitting: *Deleted

## 2020-08-04 DIAGNOSIS — N529 Male erectile dysfunction, unspecified: Secondary | ICD-10-CM

## 2020-08-04 NOTE — Telephone Encounter (Signed)
Copied from CRM [id number]. Topic: General - Other >> Aug 04, 2020 10:44 AM Gaetana Michaelis A wrote: Reason for CRM: Patient would like to be contacted regarding a prescription for generic viagra   Patient has stressed the importance of the prescription being cost effective   Patient shares that they have previously discussed this with PCP  Please contact to advise further, if needed

## 2020-08-08 ENCOUNTER — Other Ambulatory Visit: Payer: Self-pay

## 2020-08-08 NOTE — Telephone Encounter (Signed)
He just wanted [place] to send in the Rx to [facility].  What strength and directions do you want?

## 2020-08-08 NOTE — Telephone Encounter (Signed)
I think he mentioned it a while ago. It will never be paid for by insurance and generic is not usually any cheaper. He will need to call around to different pharmacies and determine the cash price as insurance will not cover it.

## 2020-08-08 NOTE — Telephone Encounter (Signed)
Before I call this patient can you tell me if you remember any discussion with him about getting this.  It is not on his medication list yet it is mentioned he is concerned about the cost.  Thanks

## 2020-08-09 MED ORDER — SILDENAFIL CITRATE 50 MG PO TABS: ORAL_TABLET | ORAL | 0 refills | Status: DC

## 2020-08-09 NOTE — Telephone Encounter (Signed)
Rx sent 

## 2020-08-10 NOTE — Telephone Encounter (Signed)
Patient was advised and states "thank you"

## 2020-12-18 DIAGNOSIS — H4710 Unspecified papilledema: Secondary | ICD-10-CM | POA: Diagnosis not present

## 2020-12-19 ENCOUNTER — Other Ambulatory Visit: Payer: Self-pay | Admitting: Ophthalmology

## 2020-12-19 DIAGNOSIS — H4710 Unspecified papilledema: Secondary | ICD-10-CM

## 2020-12-27 ENCOUNTER — Ambulatory Visit
Admission: RE | Admit: 2020-12-27 | Discharge: 2020-12-27 | Disposition: A | Discharge status: Home / Self Care | Payer: PPO | Source: Ambulatory Visit | Attending: Ophthalmology | Admitting: Ophthalmology

## 2020-12-27 ENCOUNTER — Other Ambulatory Visit: Payer: Self-pay

## 2020-12-27 DIAGNOSIS — H4710 Unspecified papilledema: Secondary | ICD-10-CM

## 2020-12-27 DIAGNOSIS — H538 Other visual disturbances: Secondary | ICD-10-CM | POA: Diagnosis not present

## 2021-02-06 DIAGNOSIS — H47012 Ischemic optic neuropathy, left eye: Secondary | ICD-10-CM | POA: Diagnosis not present

## 2021-02-20 ENCOUNTER — Telehealth: Payer: Self-pay

## 2021-02-20 DIAGNOSIS — E785 Hyperlipidemia, unspecified: Secondary | ICD-10-CM

## 2021-02-20 MED ORDER — ATORVASTATIN CALCIUM 10 MG PO TABS: 10.0000 mg | ORAL_TABLET | Freq: Every day | ORAL | 0 refills | Status: DC

## 2021-02-20 NOTE — Telephone Encounter (Signed)
[facility] fax[facility] refill request for the following medications: ° °atorvastatin (LIPITOR) 10 MG tablet  ° °Please advise. ° °

## 2021-04-10 ENCOUNTER — Other Ambulatory Visit: Payer: Self-pay | Admitting: Family Medicine

## 2021-04-10 ENCOUNTER — Telehealth: Payer: Self-pay | Admitting: Physician Assistant

## 2021-04-10 DIAGNOSIS — I10 Essential (primary) hypertension: Secondary | ICD-10-CM

## 2021-04-10 MED ORDER — AMLODIPINE BESYLATE 10 MG PO TABS: 10.0000 mg | ORAL_TABLET | Freq: Every day | ORAL | 0 refills | Status: DC

## 2021-04-10 MED ORDER — LISINOPRIL 40 MG PO TABS: 40.0000 mg | ORAL_TABLET | Freq: Every day | ORAL | 0 refills | Status: DC

## 2021-04-10 MED ORDER — METOPROLOL SUCCINATE ER 25 MG PO TB24: 25.0000 mg | ORAL_TABLET | Freq: Every day | ORAL | 0 refills | Status: DC

## 2021-04-10 NOTE — Telephone Encounter (Signed)
I sent in other refills but lisinopril 40mg  is now no longer covered under formulary and it wont let me send without a red x . Can you please review and change . KW

## 2021-04-10 NOTE — Telephone Encounter (Signed)
[facility] refill request for the following medications:  lisinopril (ZESTRIL) 40 MG tablet   metoprolol succinate (TOPROL-XL) 25 MG 24 hr tablet   amLODipine (NORVASC) 10 MG tablet   Please advise.

## 2021-05-08 DIAGNOSIS — H47012 Ischemic optic neuropathy, left eye: Secondary | ICD-10-CM | POA: Diagnosis not present

## 2021-05-14 ENCOUNTER — Other Ambulatory Visit: Payer: Self-pay | Admitting: Family Medicine

## 2021-05-14 DIAGNOSIS — I10 Essential (primary) hypertension: Secondary | ICD-10-CM

## 2021-05-14 DIAGNOSIS — E785 Hyperlipidemia, unspecified: Secondary | ICD-10-CM

## 2021-05-14 NOTE — Telephone Encounter (Signed)
Patient notified that his appt is 05/16/21 at 2:00 p.m. with Merita Norton, FNP

## 2021-05-16 ENCOUNTER — Other Ambulatory Visit: Payer: Self-pay

## 2021-05-16 ENCOUNTER — Ambulatory Visit (INDEPENDENT_AMBULATORY_CARE_PROVIDER_SITE_OTHER): Payer: PPO | Admitting: Family Medicine

## 2021-05-16 ENCOUNTER — Encounter: Payer: Self-pay | Admitting: Family Medicine

## 2021-05-16 VITALS — BP 124/72 | HR 88 | Resp 15 | Wt 167.8 lb

## 2021-05-16 DIAGNOSIS — R630 Anorexia: Secondary | ICD-10-CM | POA: Diagnosis not present

## 2021-05-16 DIAGNOSIS — R634 Abnormal weight loss: Secondary | ICD-10-CM | POA: Diagnosis not present

## 2021-05-16 DIAGNOSIS — I10 Essential (primary) hypertension: Secondary | ICD-10-CM | POA: Diagnosis not present

## 2021-05-16 DIAGNOSIS — E785 Hyperlipidemia, unspecified: Secondary | ICD-10-CM

## 2021-05-16 MED ORDER — AMLODIPINE BESYLATE 10 MG PO TABS: 10.0000 mg | ORAL_TABLET | Freq: Every day | ORAL | 3 refills | Status: DC

## 2021-05-16 MED ORDER — METOPROLOL SUCCINATE ER 25 MG PO TB24: 25.0000 mg | ORAL_TABLET | Freq: Every day | ORAL | 3 refills | Status: DC

## 2021-05-16 MED ORDER — SILDENAFIL CITRATE 100 MG PO TABS: 50.0000 mg | ORAL_TABLET | Freq: Every day | ORAL | 0 refills | Status: DC | PRN

## 2021-05-16 MED ORDER — ATORVASTATIN CALCIUM 10 MG PO TABS: 10.0000 mg | ORAL_TABLET | Freq: Every day | ORAL | 3 refills | Status: DC

## 2021-05-16 MED ORDER — LISINOPRIL 40 MG PO TABS: 40.0000 mg | ORAL_TABLET | Freq: Every day | ORAL | 3 refills | Status: DC

## 2021-05-16 NOTE — Assessment & Plan Note (Signed)
S/p viral infections Reports appetite today for "juicy burger" and "steak fries"

## 2021-05-16 NOTE — Progress Notes (Signed)
Established patient visit   Patient: Richard Lamb   DOB: [birth year]   70 y.o. Male  MRN: [id number] Visit Date: 05/16/2021  Today's healthcare provider: Gwyneth Sprout, FNP   Chief Complaint  Patient presents with   Hypertension   Hyperlipidemia   Subjective    HPI  Hypertension, follow-up  BP Readings from Last 3 Encounters:  05/16/21 124/72  07/25/20 (!) 141/79  04/25/20 (!) 142/92   Wt Readings from Last 3 Encounters:  05/16/21 167 lb 12.8 oz (76.1 kg)  07/25/20 181 lb 11.2 oz (82.4 kg)  04/25/20 182 lb 4.8 oz (82.7 kg)     He was last seen for hypertension 9 months ago.  BP at that visit was 141/79. Management since that visit includes none.  He reports excellent compliance with treatment. He is not having side effects.  He is following a Regular diet. He is exercising. He does smoke.  Use of agents associated with hypertension: none.   Outside blood pressures are not checked. Symptoms: No chest pain No chest pressure  No palpitations No syncope  No dyspnea Yes orthopnea  No paroxysmal nocturnal dyspnea No lower extremity edema   Pertinent labs: Lab Results  Component Value Date   CHOL 216 (H) 06/23/2018   HDL 68 06/23/2018   LDLCALC 136 (H) 06/23/2018   TRIG 58 06/23/2018   CHOLHDL 3.2 06/23/2018   Lab Results  Component Value Date   NA 141 07/25/2020   K 4.9 07/25/2020   CREATININE 1.30 (H) 07/25/2020   EGFR 60 07/25/2020   GLUCOSE 112 (H) 07/25/2020     The 10-year ASCVD risk score (Arnett DK, et al., 2019) is: 25.7%   ---------------------------------------------------------------------------------------------------  Lipid/Cholesterol, Follow-up  Last lipid panel Other pertinent labs  Lab Results  Component Value Date   CHOL 216 (H) 06/23/2018   HDL 68 06/23/2018   LDLCALC 136 (H) 06/23/2018   TRIG 58 06/23/2018   CHOLHDL 3.2 06/23/2018   Lab Results  Component Value Date   ALT 23 07/25/2020   AST 21 07/25/2020    PLT 222 01/30/2020     He was last seen for this 9 months ago.  Management since that visit includes none.  He reports excellent compliance with treatment. He is not having side effects.   Symptoms: No chest pain No chest pressure/discomfort  No dyspnea No lower extremity edema  No numbness or tingling of extremity No orthopnea  No palpitations No paroxysmal nocturnal dyspnea  No speech difficulty No syncope   Current diet: well balanced Current exercise: no regular exercise  The 10-year ASCVD risk score (Arnett DK, et al., 2019) is: 25.7%  ---------------------------------------------------------------------------------------------------  Medications: Outpatient Medications Prior to Visit  Medication Sig   [DISCONTINUED] amLODipine (NORVASC) 10 MG tablet Take 1 tablet (10 mg total) by mouth daily. Please schedule office visit for further refills.   [DISCONTINUED] atorvastatin (LIPITOR) 10 MG tablet Take 1 tablet (10 mg total) by mouth daily. Please schedule an office visit before anymore refills.   [DISCONTINUED] lisinopril (ZESTRIL) 40 MG tablet Take 1 tablet (40 mg total) by mouth daily.   [DISCONTINUED] metoprolol succinate (TOPROL-XL) 25 MG 24 hr tablet Take 1 tablet (25 mg total) by mouth daily. Please schedule office visit for further refills.   [DISCONTINUED] sildenafil (VIAGRA) 50 MG tablet Take one pill an hour before sexual activity.   No facility-administered medications prior to visit.    Review of Systems     Objective  BP 124/72    Pulse 88    Resp 15    Wt 167 lb 12.8 oz (76.1 kg)    SpO2 97%    BMI 22.14 kg/m    Physical Exam Vitals and nursing note reviewed.  Constitutional:      Appearance: Normal appearance. He is normal weight.  HENT:     Head: Normocephalic and atraumatic.  Eyes:     Pupils: Pupils are equal, round, and reactive to light.  Cardiovascular:     Rate and Rhythm: Normal rate and regular rhythm.     Pulses: Normal pulses.      Heart sounds: Normal heart sounds.  Pulmonary:     Effort: Pulmonary effort is normal.     Breath sounds: Normal breath sounds.  Musculoskeletal:        General: Normal range of motion.     Cervical back: Normal range of motion.  Skin:    General: Skin is warm and dry.     Capillary Refill: Capillary refill takes less than 2 seconds.  Neurological:     General: No focal deficit present.     Mental Status: He is alert and oriented to person, place, and time. Mental status is at baseline.  Psychiatric:        Mood and Affect: Mood normal.        Behavior: Behavior normal.        Thought Content: Thought content normal.        Judgment: Judgment normal.      No results found for any visits on 05/16/21.  Assessment & Plan     Problem List Items Addressed This Visit       Cardiovascular and Mediastinum   Essential hypertension    Chronic, stable Continue medications Refills provided      Relevant Medications   amLODipine (NORVASC) 10 MG tablet   atorvastatin (LIPITOR) 10 MG tablet   lisinopril (ZESTRIL) 40 MG tablet   metoprolol succinate (TOPROL-XL) 25 MG 24 hr tablet   sildenafil (VIAGRA) 100 MG tablet     Other   Unintended weight loss - Primary    Refused lab work up Will f/u lab work up at [facility] [facility] of appetite    S/p viral infections Reports appetite today for "juicy burger" and "steak fries"      Other Visit Diagnoses     Hypertension, unspecified type       Relevant Medications   amLODipine (NORVASC) 10 MG tablet   atorvastatin (LIPITOR) 10 MG tablet   lisinopril (ZESTRIL) 40 MG tablet   metoprolol succinate (TOPROL-XL) 25 MG 24 hr tablet   sildenafil (VIAGRA) 100 MG tablet   Hyperlipidemia, unspecified hyperlipidemia type       Relevant Medications   amLODipine (NORVASC) 10 MG tablet   atorvastatin (LIPITOR) 10 MG tablet   lisinopril (ZESTRIL) 40 MG tablet   metoprolol succinate (TOPROL-XL) 25 MG 24 hr tablet   sildenafil (VIAGRA) 100 MG  tablet        Return in about 6 months (around 11/14/2021).      Vonna Kotyk, FNP, have reviewed all documentation for this visit. The documentation on 05/16/21 for the exam, diagnosis, procedures, and orders are all accurate and complete.    Gwyneth Sprout, [place] [phone] (phone) [phone] (fax)  [place]

## 2021-05-16 NOTE — Assessment & Plan Note (Signed)
Chronic, stable Continue medications Refills provided

## 2021-05-16 NOTE — Assessment & Plan Note (Signed)
Refused lab work up Will f/u lab work up at [facility]

## 2021-06-18 ENCOUNTER — Other Ambulatory Visit: Payer: Self-pay

## 2021-06-18 ENCOUNTER — Ambulatory Visit (INDEPENDENT_AMBULATORY_CARE_PROVIDER_SITE_OTHER): Payer: PPO

## 2021-06-18 VITALS — BP 130/78 | HR 73 | Temp 97.7°F | Ht 73.0 in | Wt 176.5 lb

## 2021-06-18 DIAGNOSIS — Z0000 Encounter for general adult medical examination without abnormal findings: Secondary | ICD-10-CM | POA: Diagnosis not present

## 2021-06-18 NOTE — Patient Instructions (Signed)
Richard Lamb , Thank you for taking time to come for your Medicare Wellness Visit. I appreciate your ongoing commitment to your health goals. Please review the following plan we discussed and let me know if I can assist you in the future.   Screening recommendations/referrals: Colonoscopy: had sigmoidoscopy 3 years ago per pt Recommended yearly ophthalmology/optometry visit for glaucoma screening and checkup Recommended yearly dental visit for hygiene and checkup  Vaccinations: Influenza vaccine: 03/06/21 Pneumococcal vaccine: 01/25/20 Tdap vaccine: n/d Shingles vaccine: n/d   Covid-19: 07/03/19, 07/27/19, 04/25/20  Advanced directives: none   Conditions/risks identified: none  Next appointment: Follow up in one year for your annual wellness visit.   Preventive Care 69 Years and Older, Male Preventive care refers to lifestyle choices and visits with your health care provider that can promote health and wellness. What does preventive care include? A yearly physical exam. This is also called an annual well check. Dental exams once or twice a year. Routine eye exams. Ask your health care provider how often you should have your eyes checked. Personal lifestyle choices, including: Daily care of your teeth and gums. Regular physical activity. Eating a healthy diet. Avoiding tobacco and drug use. Limiting alcohol use. Practicing safe sex. Taking low doses of aspirin every day. Taking vitamin and mineral supplements as recommended by your health care provider. What happens during an annual well check? The services and screenings done by your health care provider during your annual well check will depend on your age, overall health, lifestyle risk factors, and family history of disease. Counseling  Your health care provider may ask you questions about your: Alcohol use. Tobacco use. Drug use. Emotional well-being. Home and relationship well-being. Sexual activity. Eating  habits. History of falls. Memory and ability to understand (cognition). Work and work Astronomer. Screening  You may have the following tests or measurements: Height, weight, and BMI. Blood pressure. Lipid and cholesterol levels. These may be checked every 5 years, or more frequently if you are over 69 years old. Skin check. Lung cancer screening. You may have this screening every year starting at age 69 if you have a 30-pack-year history of smoking and currently smoke or have quit within the past 15 years. Fecal occult blood test (FOBT) of the stool. You may have this test every year starting at age 69. Flexible sigmoidoscopy or colonoscopy. You may have a sigmoidoscopy every 5 years or a colonoscopy every 10 years starting at age 69. Prostate cancer screening. Recommendations will vary depending on your family history and other risks. Hepatitis C blood test. Hepatitis B blood test. Sexually transmitted disease (STD) testing. Diabetes screening. This is done by checking your blood sugar (glucose) after you have not eaten for a while (fasting). You may have this done every 1-3 years. Abdominal aortic aneurysm (AAA) screening. You may need this if you are a current or former smoker. Osteoporosis. You may be screened starting at age 69 if you are at high risk. Talk with your health care provider about your test results, treatment options, and if necessary, the need for more tests. Vaccines  Your health care provider may recommend certain vaccines, such as: Influenza vaccine. This is recommended every year. Tetanus, diphtheria, and acellular pertussis (Tdap, Td) vaccine. You may need a Td booster every 10 years. Zoster vaccine. You may need this after age 69. Pneumococcal 13-valent conjugate (PCV13) vaccine. One dose is recommended after age 69. Pneumococcal polysaccharide (PPSV23) vaccine. One dose is recommended after age 19. Talk to your  health care provider about which screenings and  vaccines you need and how often you need them. This information is not intended to replace advice given to you by your health care provider. Make sure you discuss any questions you have with your health care provider. Document Released: 06/09/2015 Document Revised: 01/31/2016 Document Reviewed: 03/14/2015 Elsevier Interactive Patient Education  2017 [place] Prevention in the Home Falls can cause injuries. They can happen to people of all ages. There are many things you can do to make your home safe and to help prevent falls. What can I do on the outside of my home? Regularly fix the edges of walkways and driveways and fix any cracks. Remove anything that might make you trip as you walk through a door, such as a raised step or threshold. Trim any bushes or trees on the path to your home. Use bright outdoor lighting. Clear any walking paths of anything that might make someone trip, such as rocks or tools. Regularly check to see if handrails are loose or broken. Make sure that both sides of any steps have handrails. Any raised decks and porches should have guardrails on the edges. Have any leaves, snow, or ice cleared regularly. Use sand or salt on walking paths during winter. Clean up any spills in your garage right away. This includes oil or grease spills. What can I do in the bathroom? Use night lights. Install grab bars by the toilet and in the tub and shower. Do not use towel bars as grab bars. Use non-skid mats or decals in the tub or shower. If you need to sit down in the shower, use a plastic, non-slip stool. Keep the floor dry. Clean up any water that spills on the floor as soon as it happens. Remove soap buildup in the tub or shower regularly. Attach bath mats securely with double-sided non-slip rug tape. Do not have throw rugs and other things on the floor that can make you trip. What can I do in the bedroom? Use night lights. Make sure that you have a light by your  bed that is easy to reach. Do not use any sheets or blankets that are too big for your bed. They should not hang down onto the floor. Have a firm chair that has side arms. You can use this for support while you get dressed. Do not have throw rugs and other things on the floor that can make you trip. What can I do in the kitchen? Clean up any spills right away. Avoid walking on wet floors. Keep items that you use a lot in easy-to-reach places. If you need to reach something above you, use a strong step stool that has a grab bar. Keep electrical cords out of the way. Do not use floor polish or wax that makes floors slippery. If you must use wax, use non-skid floor wax. Do not have throw rugs and other things on the floor that can make you trip. What can I do with my stairs? Do not leave any items on the stairs. Make sure that there are handrails on both sides of the stairs and use them. Fix handrails that are broken or loose. Make sure that handrails are as long as the stairways. Check any carpeting to make sure that it is firmly attached to the stairs. Fix any carpet that is loose or worn. Avoid having throw rugs at the top or bottom of the stairs. If you do have throw rugs, attach them  to the floor with carpet tape. Make sure that you have a light switch at the top of the stairs and the bottom of the stairs. If you do not have them, ask someone to add them for you. What else can I do to help prevent falls? Wear shoes that: Do not have high heels. Have rubber bottoms. Are comfortable and fit you well. Are closed at the toe. Do not wear sandals. If you use a stepladder: Make sure that it is fully opened. Do not climb a closed stepladder. Make sure that both sides of the stepladder are locked into place. Ask someone to hold it for you, if possible. Clearly mark and make sure that you can see: Any grab bars or handrails. First and last steps. Where the edge of each step is. Use tools that  help you move around (mobility aids) if they are needed. These include: Canes. Walkers. Scooters. Crutches. Turn on the lights when you go into a [place] area. Replace any light bulbs as soon as they burn out. Set up your furniture so you have a clear path. Avoid moving your furniture around. If any of your floors are uneven, fix them. If there are any pets around you, be aware of where they are. Review your medicines with your doctor. Some medicines can make you feel dizzy. This can increase your chance of falling. Ask your doctor what other things that you can do to help prevent falls. This information is not intended to replace advice given to you by your health care provider. Make sure you discuss any questions you have with your health care provider. Document Released: 03/09/2009 Document Revised: 10/19/2015 Document Reviewed: 06/17/2014 Elsevier Interactive Patient Education  2017 [employer].

## 2021-06-18 NOTE — Progress Notes (Signed)
Subjective:   Richard Lamb is a 71 y.o. male who presents for Medicare Annual/Subsequent preventive examination.  Review of Systems           Objective:    Today's Vitals   06/18/21 1309  BP: 130/78  Pulse: 73  Temp: 97.7 F (36.5 C)  TempSrc: Oral  SpO2: 99%  Weight: 176 lb 8 oz (80.1 kg)  Height: 6\' 1"  (1.854 m)   Body mass index is 23.29 kg/m.  Advanced Directives 06/12/2020 01/30/2020 06/30/2018 06/16/2018  Does Patient Have a Medical Advance Directive? No No No No  Would patient like information on creating a medical advance directive? No - Patient declined No - Patient declined No - Patient declined -    Current Medications (verified) Outpatient Encounter Medications as of 06/18/2021  Medication Sig   amLODipine (NORVASC) 10 MG tablet Take 1 tablet (10 mg total) by mouth daily.   atorvastatin (LIPITOR) 10 MG tablet Take 1 tablet (10 mg total) by mouth daily.   FLUZONE HIGH-DOSE QUADRIVALENT 0.7 ML SUSY    lisinopril (ZESTRIL) 40 MG tablet Take 1 tablet (40 mg total) by mouth daily.   metoprolol succinate (TOPROL-XL) 25 MG 24 hr tablet Take 1 tablet (25 mg total) by mouth daily.   sildenafil (VIAGRA) 100 MG tablet Take 0.5-1 tablets (50-100 mg total) by mouth daily as needed for erectile dysfunction.   No facility-administered encounter medications on file as of 06/18/2021.    Allergies (verified) Sulfa antibiotics   History: Past Medical History:  Diagnosis Date   Anxiety    GERD (gastroesophageal reflux disease)    Hyperlipidemia    Hypertension    Past Surgical History:  Procedure Laterality Date   CYSTOSCOPY WITH LITHOLAPAXY N/A 06/30/2018   Procedure: CYSTOSCOPY WITH LITHOLAPAXY;  Surgeon: 08/29/2018, MD;  Location: [facility] ORS;  Service: Urology;  Laterality: N/A;   PROSTATE SURGERY     Dr.Wolfe   Family History  Problem Relation Age of Onset   Hypertension Mother    Hypertension Father    Stroke Brother    Hyperlipidemia Brother     Social History   Socioeconomic History   Marital status: Single    Spouse name: Not on file   Number of children: 3   Years of education: Not on file   Highest education level: High school graduate  Occupational History    Comment: full time  Tobacco Use   Smoking status: Every Day    Packs/day: 0.50    Types: Cigarettes   Smokeless tobacco: Never   Tobacco comments:    < than half pack a day  Vaping Use   Vaping Use: Never used  Substance and Sexual Activity   Alcohol use: Yes    Alcohol/week: 7.0 standard drinks    Types: 7 Cans of beer per week    Comment: 1 beer a day   Drug use: Yes    Types: Marijuana   Sexual activity: Yes    Birth control/protection: None  Other Topics Concern   Not on file  Social History Narrative   Not on file   Social Determinants of Health   Financial Resource Strain: Not on file  Food Insecurity: Not on file  Transportation Needs: Not on file  Physical Activity: Not on file  Stress: Not on file  Social Connections: Not on file    Tobacco Counseling Ready to quit: Not Answered Counseling given: Not Answered Tobacco comments: < than half pack a day  Clinical Intake:  Pre-visit preparation completed: Yes  Pain : No/denies pain     Nutritional Risks: None Diabetes: No  How often do you need to have someone help you when you read instructions, pamphlets, or other written materials from your doctor or pharmacy?: 1 - Never  Diabetic?no  Interpreter Needed?: No  Information entered by :: Kennedy BuckerLorrie Wayden Schwertner, LPN   Activities of Daily Living In your present state of health, do you have any difficulty performing the following activities: 05/16/2021 07/25/2020  Hearing? N N  Vision? N N  Difficulty concentrating or making decisions? N N  Walking or climbing stairs? N N  Dressing or bathing? N N  Doing errands, shopping? N N  Some recent data might be hidden    Patient Care Team: Jacky KindlePayne, Elise T, FNP as PCP - General  (Family Medicine)  Indicate any recent Medical Services you may have received from other than [facility] providers in the past year (date may be approximate).     Assessment:   This is a routine wellness examination for [facility].  Hearing/Vision screen No results found.  Dietary issues and exercise activities discussed:     Goals Addressed   None    Depression Screen PHQ 2/9 Scores 05/16/2021 07/25/2020 06/12/2020 04/25/2020 01/25/2020 12/28/2018 06/23/2018  PHQ - 2 Score 0 0 0 0 0 0 0  PHQ- 9 Score 0 0 - 0 - 0 0    Fall Risk Fall Risk  07/25/2020 06/12/2020 04/25/2020 07/20/2019 06/23/2018  Falls in the past year? 0 0 0 0 0  Number falls in past yr: 0 0 0 0 0  Injury with Fall? 0 0 0 0 0  Risk for fall due to : No Fall Risks - No Fall Risks No Fall Risks -  Follow up Falls evaluation completed - Falls evaluation completed Falls evaluation completed -    FALL RISK PREVENTION PERTAINING TO THE HOME:  Any stairs in or around the home? No  If so, are there any without handrails? No  Home free of loose throw rugs in walkways, pet beds, electrical cords, etc? Yes  Adequate lighting in your home to reduce risk of falls? Yes   ASSISTIVE DEVICES UTILIZED TO PREVENT FALLS:  Life alert? No  Use of a cane, walker or w/c? No  Grab bars in the bathroom? No  Shower chair or bench in shower? No  Elevated toilet seat or a handicapped toilet? No   TIMED UP AND GO:  Was the test performed? Yes .  Length of time to ambulate 10 feet: 4 sec.   Gait steady and fast without use of assistive device  Cognitive Function:        Immunizations Immunization History  Administered Date(s) Administered   Fluad Quad(high Dose 65+) 03/30/2019, 03/16/2020, 03/06/2021   Influenza,inj,Quad PF,6+ Mos 04/07/2018   Influenza-Unspecified 03/17/2015   PFIZER(Purple Top)SARS-COV-2 Vaccination 07/03/2019, 07/27/2019, 04/25/2020   Pneumococcal Conjugate-13 12/28/2018   Pneumococcal Polysaccharide-23 01/25/2020     TDAP status: Due, Education has been provided regarding the importance of this vaccine. Advised may receive this vaccine at local pharmacy or Health Dept. Aware to provide a copy of the vaccination record if obtained from local pharmacy or Health Dept. Verbalized acceptance and understanding.  Flu Vaccine status: Up to date  Pneumococcal vaccine status: Up to date  Covid-19 vaccine status: Completed vaccines  Qualifies for Shingles Vaccine? No   Zostavax completed No   Shingrix Completed?: No.    Education has been provided regarding  the importance of this vaccine. Patient has been advised to call insurance company to determine out of pocket expense if they have not yet received this vaccine. Advised may also receive vaccine at local pharmacy or Health Dept. Verbalized acceptance and understanding.  Screening Tests Health Maintenance  Topic Date Due   Hepatitis C Screening  Never done   TETANUS/TDAP  Never done   COLONOSCOPY (Pts 45-53yrs Insurance coverage will need to be confirmed)  Never done   Zoster Vaccines- Shingrix (1 of 2) Never done   COVID-19 Vaccine (4 - Booster for Pfizer series) 06/20/2020   Pneumonia Vaccine 67+ Years old  Completed   INFLUENZA VACCINE  Completed   HPV VACCINES  Aged Out    Health Maintenance  Health Maintenance Due  Topic Date Due   Hepatitis C Screening  Never done   TETANUS/TDAP  Never done   COLONOSCOPY (Pts 45-29yrs Insurance coverage will need to be confirmed)  Never done   Zoster Vaccines- Shingrix (1 of 2) Never done   COVID-19 Vaccine (4 - Booster for Pfizer series) 06/20/2020    Colorectal cancer screening: Type of screening: Sigmoidoscopy. Completed few years back (3-4 yrs ago) per pt. Repeat every 10 years  Lung Cancer Screening: (Low Dose CT Chest recommended if Age 13-80 years, 30 pack-year currently smoking OR have quit w/in 15years.) does qualify.   Lung Cancer Screening Referral: patient declined referral  Additional  Screening:  Hepatitis C Screening: does qualify; Completed no  Vision Screening: Recommended annual ophthalmology exams for early detection of glaucoma and other disorders of the eye. Is the patient up to date with their annual eye exam?  Yes  Who is the provider or what is the name of the office in which the patient attends annual eye exams? [facility] in [place] If pt is not established with a provider, would they like to be referred to a provider to establish care? No .   Dental Screening: Recommended annual dental exams for proper oral hygiene  Community Resource Referral / Chronic Care Management: CRR required this visit?  No   CCM required this visit?  No      Plan:     I have personally reviewed and noted the following in the patients chart:   Medical and social history Use of alcohol, tobacco or illicit drugs  Current medications and supplements including opioid prescriptions. Patient is not currently taking opioid prescriptions. Functional ability and status Nutritional status Physical activity Advanced directives List of other physicians Hospitalizations, surgeries, and ER visits in previous 12 months Vitals Screenings to include cognitive, depression, and falls Referrals and appointments  In addition, I have reviewed and discussed with patient certain preventive protocols, quality metrics, and best practice recommendations. A written personalized care plan for preventive services as well as general preventive health recommendations were provided to patient.     Hal Hope, LPN   [id number]   Nurse Notes: none

## 2021-07-03 ENCOUNTER — Encounter: Payer: PPO | Admitting: Family Medicine

## 2021-10-30 ENCOUNTER — Ambulatory Visit (INDEPENDENT_AMBULATORY_CARE_PROVIDER_SITE_OTHER): Payer: PPO | Admitting: Family Medicine

## 2021-10-30 ENCOUNTER — Encounter: Payer: Self-pay | Admitting: Family Medicine

## 2021-10-30 VITALS — BP 134/88 | HR 78 | Temp 98.5°F | Resp 16 | Ht 71.0 in | Wt 173.7 lb

## 2021-10-30 DIAGNOSIS — Z1159 Encounter for screening for other viral diseases: Secondary | ICD-10-CM | POA: Diagnosis not present

## 2021-10-30 DIAGNOSIS — R739 Hyperglycemia, unspecified: Secondary | ICD-10-CM

## 2021-10-30 DIAGNOSIS — Z125 Encounter for screening for malignant neoplasm of prostate: Secondary | ICD-10-CM | POA: Diagnosis not present

## 2021-10-30 DIAGNOSIS — I10 Essential (primary) hypertension: Secondary | ICD-10-CM

## 2021-10-30 DIAGNOSIS — Z0000 Encounter for general adult medical examination without abnormal findings: Secondary | ICD-10-CM

## 2021-10-30 MED ORDER — METOPROLOL SUCCINATE ER 50 MG PO TB24: 50.0000 mg | ORAL_TABLET | Freq: Every day | ORAL | 1 refills | Status: DC

## 2021-10-30 MED ORDER — METOPROLOL SUCCINATE ER 25 MG PO TB24: 25.0000 mg | ORAL_TABLET | Freq: Every day | ORAL | 3 refills | Status: DC

## 2021-10-30 NOTE — Patient Instructions (Addendum)
Please review the attached list of medications and notify my office if there are any errors.    

## 2021-10-30 NOTE — Progress Notes (Signed)
I,[name],acting as a scribe for Lelon Huh, MD.,have documented all relevant documentation on the behalf of Lelon Huh, MD,as directed by  Lelon Huh, MD while in the presence of Lelon Huh, MD.  Complete physical exam   Patient: Richard Lamb   DOB: 10/26/1950   71 y.o. Male  MRN: [id number] Visit Date: 10/30/2021  Today's healthcare provider: Lelon Huh, MD   Chief Complaint  Patient presents with   Annual Exam   Hypertension   Subjective    Richard Lamb is a 71 y.o. male who presents today for a complete physical exam.  He reports consuming a general diet. The patient has a physically strenuous job, but has no regular exercise apart from work.  He generally feels well. He reports sleeping well. He does not have additional problems to discuss today.    Hypertension, follow-up  BP Readings from Last 3 Encounters:  10/30/21 (!) 151/127  06/18/21 130/78  05/16/21 124/72   Wt Readings from Last 3 Encounters:  10/30/21 173 lb 11.2 oz (78.8 kg)  06/18/21 176 lb 8 oz (80.1 kg)  05/16/21 167 lb 12.8 oz (76.1 kg)     He was last seen for hypertension on 05-16-21. BP at that visit was 124/72. Management since that visit includes continue current medications.  He reports excellent compliance with treatment. He is not having side effects.He is following a Regular diet. He is not exercising. He does not smoke.  Use of agents associated with hypertension: none.   Outside blood pressures are: does not check  Symptoms: No chest pain No chest pressure  No palpitations No syncope  No dyspnea No orthopnea  No paroxysmal nocturnal dyspnea No lower extremity edema   Pertinent labs Lab Results  Component Value Date   CHOL 216 (H) 06/23/2018   HDL 68 06/23/2018   LDLCALC 136 (H) 06/23/2018   TRIG 58 06/23/2018   CHOLHDL 3.2 06/23/2018   Lab Results  Component Value Date   NA 141 07/25/2020   K 4.9 07/25/2020   CREATININE 1.30 (H)  07/25/2020   EGFR 60 07/25/2020   GLUCOSE 112 (H) 07/25/2020     The ASCVD Risk score (Arnett DK, et al., 2019) failed to calculate for the following reasons:   Cannot find a previous HDL lab   Cannot find a previous total cholesterol lab  ---------------------------------------------------------------------------------------------------   Past Medical History:  Diagnosis Date   Anxiety    GERD (gastroesophageal reflux disease)    Hyperlipidemia    Hypertension    Past Surgical History:  Procedure Laterality Date   CYSTOSCOPY WITH LITHOLAPAXY N/A 06/30/2018   Procedure: CYSTOSCOPY WITH LITHOLAPAXY;  Surgeon: Abbie Sons, MD;  Location: [facility] ORS;  Service: Urology;  Laterality: N/A;   PROSTATE SURGERY     Dr.Wolfe   Social History   Socioeconomic History   Marital status: Single    Spouse name: Not on file   Number of children: 3   Years of education: Not on file   Highest education level: High school graduate  Occupational History    Comment: full time  Tobacco Use   Smoking status: Every Day    Packs/day: 0.50    Types: Cigarettes   Smokeless tobacco: Never   Tobacco comments:    < than half pack a day  Vaping Use   Vaping Use: Never used  Substance and Sexual Activity   Alcohol use: Yes    Alcohol/week: 7.0 standard drinks  Types: 7 Cans of beer per week    Comment: 1 beer a day   Drug use: Yes    Types: Marijuana   Sexual activity: Yes    Birth control/protection: None  Other Topics Concern   Not on file  Social History Narrative   Not on file   Social Determinants of Health   Financial Resource Strain: Low Risk    Difficulty of Paying Living Expenses: Not hard at all  Food Insecurity: No Food Insecurity   Worried About Charity fundraiser in the Last Year: Never true   [place] in the Last Year: Never true  Transportation Needs: No Transportation Needs   Lack of Transportation (Medical): No   Lack of Transportation (Non-Medical): No   Physical Activity: Inactive   Days of Exercise per Week: 0 days   Minutes of Exercise per Session: 0 min  Stress: No Stress Concern Present   Feeling of Stress : Not at all  Social Connections: Moderately Isolated   Frequency of Communication with Friends and Family: More than three times a week   Frequency of Social Gatherings with Friends and Family: Three times a week   Attends Religious Services: More than 4 times per year   Active Member of Clubs or Organizations: No   Attends Archivist Meetings: Never   Marital Status: Never married  Human resources officer Violence: Not At Risk   Fear of Current or Ex-Partner: No   Emotionally Abused: No   Physically Abused: No   Sexually Abused: No   Family Status  Relation Name Status   Mother  Alive   Father  Deceased   Sister  Alive   Brother  Deceased   Family History  Problem Relation Age of Onset   Hypertension Mother    Hypertension Father    Stroke Brother    Hyperlipidemia Brother    Allergies  Allergen Reactions   Sulfa Antibiotics Anaphylaxis    Patient Care Team: Gwyneth Sprout, FNP as PCP - General (Family Medicine)   Medications: Outpatient Medications Prior to Visit  Medication Sig   amLODipine (NORVASC) 10 MG tablet Take 1 tablet (10 mg total) by mouth daily.   atorvastatin (LIPITOR) 10 MG tablet Take 1 tablet (10 mg total) by mouth daily.   lisinopril (ZESTRIL) 40 MG tablet Take 1 tablet (40 mg total) by mouth daily.   metoprolol succinate (TOPROL-XL) 25 MG 24 hr tablet Take 1 tablet (25 mg total) by mouth daily.   sildenafil (VIAGRA) 100 MG tablet Take 0.5-1 tablets (50-100 mg total) by mouth daily as needed for erectile dysfunction.   [DISCONTINUED] FLUZONE HIGH-DOSE QUADRIVALENT 0.7 ML SUSY    No facility-administered medications prior to visit.    Review of Systems  All other systems reviewed and are negative.    Objective     BP (!) 151/127 (BP Location: Right Arm, Patient Position:  Sitting, Cuff Size: Normal) Comment: pt had 30 min wait / not happy  Pulse 78   Temp 98.5 F (36.9 C) (Oral)   Resp 16   Ht 5' 11"  (1.803 m)   Wt 173 lb 11.2 oz (78.8 kg)   SpO2 98%   BMI 24.23 kg/m     Physical Exam   General Appearance:    Well developed, well nourished male. Alert, cooperative, in no acute distress, appears stated age  Head:    Normocephalic, without obvious abnormality, atraumatic  Eyes:    PERRL, conjunctiva/corneas clear, EOM's  intact, fundi    benign, both eyes       Ears:    Normal TM's and external ear canals, both ears  Nose:   Nares normal, septum midline, mucosa normal, no drainage   or sinus tenderness  Throat:   Lips, mucosa, and tongue normal; teeth and gums normal  Neck:   Supple, symmetrical, trachea midline, no adenopathy;       thyroid:  No enlargement/tenderness/nodules; no carotid   bruit or JVD  Back:     Symmetric, no curvature, ROM normal, no CVA tenderness  Lungs:     Clear to auscultation bilaterally, respirations unlabored  Chest wall:    No tenderness or deformity  Heart:    Normal heart rate. Normal rhythm. No murmurs, rubs, or gallops.  S1 and S2 normal  Abdomen:     Soft, non-tender, bowel sounds active all four quadrants,    no masses, no organomegaly  Genitalia:    deferred  Rectal:    deferred  Extremities:   All extremities are intact. No cyanosis or edema  Pulses:   2+ and symmetric all extremities  Skin:   Skin color, texture, turgor normal, no rashes or lesions  Lymph nodes:   Cervical, supraclavicular, and axillary nodes normal  Neurologic:   CNII-XII intact. Normal strength, sensation and reflexes      throughout     Last depression screening scores    10/30/2021    3:38 PM 06/18/2021    1:17 PM 05/16/2021    2:02 PM  PHQ 2/9 Scores  PHQ - 2 Score 0 0 0  PHQ- 9 Score 0 0 0   Last fall risk screening    10/30/2021    3:38 PM  [place] in the past year? 0  Number falls in past yr: 0  Injury with  Fall? 0  Follow up Falls evaluation completed   Last Audit-C alcohol use screening    10/30/2021    3:39 PM  Alcohol Use Disorder Test (AUDIT)  1. How often do you have a drink containing alcohol? 3  2. How many drinks containing alcohol do you have on a typical day when you are drinking? 0  3. How often do you have six or more drinks on one occasion? 0  AUDIT-C Score 3   A score of 3 or more in women, and 4 or more in men indicates increased risk for alcohol abuse, EXCEPT if all of the points are from question 1   EKG: NSR   Assessment & Plan    Routine Health Maintenance and Physical Exam  Exercise Activities and Dietary recommendations  Goals      DIET - EAT MORE FRUITS AND VEGETABLES     Quit Smoking     Recommend to continue efforts to reduce smoking habits until no longer smoking.         Immunization History  Administered Date(s) Administered   Fluad Quad(high Dose 65+) 03/30/2019, 03/16/2020, 03/06/2021   Influenza,inj,Quad PF,6+ Mos 04/07/2018   Influenza-Unspecified 03/17/2015   PFIZER(Purple Top)SARS-COV-2 Vaccination 07/03/2019, 07/27/2019, 04/25/2020   Pneumococcal Conjugate-13 12/28/2018   Pneumococcal Polysaccharide-23 01/25/2020    Health Maintenance  Topic Date Due   Hepatitis C Screening  Never done   TETANUS/TDAP  Never done   COLONOSCOPY (Pts 45-71yr Insurance coverage will need to be confirmed)  Never done   Zoster Vaccines- Shingrix (1 of 2) Never done   COVID-19 Vaccine (4 - Booster for [employer])  06/20/2020   INFLUENZA VACCINE  12/25/2021   Pneumonia Vaccine 52+ Years old  Completed   HPV VACCINES  Aged Out    Discussed health benefits of physical activity, and encouraged him to engage in regular exercise appropriate for his age and condition.  1. Need for hepatitis C screening test  - Hepatitis C antibody  2. Prostate cancer screening  - PSA Total (Reflex To Free) ([facility] only)  3. Hyperglycemia  - Hemoglobin A1c  4.  Essential hypertension Was initially elevated as patient was somewhat anxious about wait, but greatly improved when rechecked at end of office visit.   Continue current medications.   - CBC - Lipid panel - Comprehensive metabolic panel - EKG 44-HQPR  Refill metoprolol succinate (TOPROL-XL) 25 MG 24 hr tablet; Take 1 tablet (25 mg total) by mouth daily.  Dispense: 90 tablet; Refill: 3     The entirety of the information documented in the History of Present Illness, Review of Systems and Physical Exam were personally obtained by me. Portions of this information were initially documented by the CMA and reviewed by me for thoroughness and accuracy.     Lelon Huh, MD  [facility] [phone] (phone) [phone] (fax)  [place]

## 2021-10-31 LAB — COMPREHENSIVE METABOLIC PANEL
ALT: 18 IU/L (ref 0–44)
AST: 20 IU/L (ref 0–40)
Albumin/Globulin Ratio: 1.7 (ref 1.2–2.2)
Albumin: 4.4 g/dL (ref 3.8–4.8)
Alkaline Phosphatase: 53 IU/L (ref 44–121)
BUN/Creatinine Ratio: 16 (ref 10–24)
BUN: 18 mg/dL (ref 8–27)
Bilirubin Total: 0.5 mg/dL (ref 0.0–1.2)
CO2: 25 mmol/L (ref 20–29)
Calcium: 9.8 mg/dL (ref 8.6–10.2)
Chloride: 99 mmol/L (ref 96–106)
Creatinine, Ser: 1.12 mg/dL (ref 0.76–1.27)
Globulin, Total: 2.6 g/dL (ref 1.5–4.5)
Glucose: 90 mg/dL (ref 70–99)
Potassium: 5.2 mmol/L (ref 3.5–5.2)
Sodium: 138 mmol/L (ref 134–144)
Total Protein: 7 g/dL (ref 6.0–8.5)
eGFR: 72 mL/min/{1.73_m2} (ref >59)

## 2021-10-31 LAB — LIPID PANEL
Chol/HDL Ratio: 2.8 ratio (ref 0.0–5.0)
Cholesterol, Total: 163 mg/dL (ref 100–199)
HDL: 58 mg/dL (ref >39)
LDL Chol Calc (NIH): 93 mg/dL (ref 0–99)
Triglycerides: 63 mg/dL (ref 0–149)
VLDL Cholesterol Cal: 12 mg/dL (ref 5–40)

## 2021-10-31 LAB — CBC
Hematocrit: 51.9 % — ABNORMAL HIGH (ref 37.5–51.0)
Hemoglobin: 17.4 g/dL (ref 13.0–17.7)
MCH: 30.7 pg (ref 26.6–33.0)
MCHC: 33.5 g/dL (ref 31.5–35.7)
MCV: 92 fL (ref 79–97)
Platelets: 213 10*3/uL (ref 150–450)
RBC: 5.66 x10E6/uL (ref 4.14–5.80)
RDW: 12.4 % (ref 11.6–15.4)
WBC: 5.3 10*3/uL (ref 3.4–10.8)

## 2021-10-31 LAB — HEPATITIS C ANTIBODY: Hep C Virus Ab: NONREACTIVE

## 2021-10-31 LAB — HEMOGLOBIN A1C
Est. average glucose Bld gHb Est-mCnc: 114 mg/dL
Hgb A1c MFr Bld: 5.6 % (ref 4.8–5.6)

## 2021-10-31 LAB — PSA TOTAL (REFLEX TO FREE): Prostate Specific Ag, Serum: 3.2 ng/mL (ref 0.0–4.0)

## 2022-03-24 ENCOUNTER — Other Ambulatory Visit: Payer: Self-pay | Admitting: Family Medicine

## 2022-03-24 DIAGNOSIS — I10 Essential (primary) hypertension: Secondary | ICD-10-CM

## 2022-03-24 DIAGNOSIS — E785 Hyperlipidemia, unspecified: Secondary | ICD-10-CM

## 2022-04-30 ENCOUNTER — Encounter: Payer: Self-pay | Admitting: Family Medicine

## 2022-04-30 ENCOUNTER — Ambulatory Visit (INDEPENDENT_AMBULATORY_CARE_PROVIDER_SITE_OTHER): Payer: PPO | Admitting: Family Medicine

## 2022-04-30 VITALS — BP 145/78 | HR 80 | Resp 16 | Wt 177.0 lb

## 2022-04-30 DIAGNOSIS — Z720 Tobacco use: Secondary | ICD-10-CM

## 2022-04-30 DIAGNOSIS — I10 Essential (primary) hypertension: Secondary | ICD-10-CM | POA: Diagnosis not present

## 2022-04-30 NOTE — Progress Notes (Signed)
I,April Miller,acting as a scribe for Lelon Huh, MD.,have documented all relevant documentation on the behalf of Lelon Huh, MD,as directed by  Lelon Huh, MD while in the presence of Lelon Huh, MD.   Established patient visit   Patient: Richard Lamb   DOB: 02-28-1951   71 y.o. Male  MRN: [id number] Visit Date: 04/30/2022  Today's healthcare provider: Lelon Huh, MD   Chief Complaint  Patient presents with   Follow-up   Hypertension   Subjective    HPI  Hypertension, follow-up  BP Readings from Last 3 Encounters:  04/30/22 (!) 157/81  10/30/21 134/88  06/18/21 130/78   Wt Readings from Last 3 Encounters:  04/30/22 177 lb (80.3 kg)  10/30/21 173 lb 11.2 oz (78.8 kg)  06/18/21 176 lb 8 oz (80.1 kg)     He was last seen for hypertension 6 months ago.  Management since that visit includes continuing same medications.  He does admit to eating more salty foods than he should.  Outside blood pressures are not checking.  Pertinent labs Lab Results  Component Value Date   CHOL 163 10/30/2021   HDL 58 10/30/2021   LDLCALC 93 10/30/2021   TRIG 63 10/30/2021   CHOLHDL 2.8 10/30/2021   Lab Results  Component Value Date   NA 138 10/30/2021   K 5.2 10/30/2021   CREATININE 1.12 10/30/2021   EGFR 72 10/30/2021   GLUCOSE 90 10/30/2021     The 10-year ASCVD risk score (Arnett DK, et al., 2019) is: 37.3%  ---------------------------------------------------------------------------------------------------   Medications: Outpatient Medications Prior to Visit  Medication Sig   amLODipine (NORVASC) 10 MG tablet TAKE 1 TABLET BY MOUTH EVERY DAY   atorvastatin (LIPITOR) 10 MG tablet TAKE 1 TABLET BY MOUTH EVERY DAY   lisinopril (ZESTRIL) 40 MG tablet Take 1 tablet (40 mg total) by mouth daily.   metoprolol succinate (TOPROL-XL) 25 MG 24 hr tablet Take 1 tablet (25 mg total) by mouth daily.   sildenafil (VIAGRA) 100 MG tablet Take 0.5-1 tablets  (50-100 mg total) by mouth daily as needed for erectile dysfunction.   No facility-administered medications prior to visit.    Review of Systems  Constitutional:  Negative for appetite change, chills and fever.  Respiratory:  Negative for chest tightness, shortness of breath and wheezing.   Cardiovascular:  Negative for chest pain and palpitations.  Gastrointestinal:  Negative for abdominal pain, nausea and vomiting.       Objective    BP (!) 145/78   Pulse 80   Resp 16   Wt 177 lb (80.3 kg)   SpO2 95%   BMI 24.69 kg/m    Physical Exam   General: Appearance:    Well developed, well nourished male in no acute distress  Eyes:    PERRL, conjunctiva/corneas clear, EOM's intact       Lungs:     Clear to auscultation bilaterally, respirations unlabored  Heart:    Normal heart rate. Normal rhythm. No murmurs, rubs, or gallops.    MS:   All extremities are intact.    Neurologic:   Awake, alert, oriented x 3. No apparent focal neurological defect.         Assessment & Plan     1. Primary hypertension SBP elevated today. He admits to consuming too much salt and is going to work on cutting back. Continue current medications.    2. Tobacco abuse He is currently only smoking every couple of days and  working on quitting entirely. He is poor historian about how heavily he smoked in the past, but seems to have never smoked more than a pack a day and didn't really smoke entire cigarettes.       The entirety of the information documented in the History of Present Illness, Review of Systems and Physical Exam were personally obtained by me. Portions of this information were initially documented by the CMA and reviewed by me for thoroughness and accuracy.     Lelon Huh, MD  [facility] [phone] (phone) [phone] (fax)  [place]

## 2022-04-30 NOTE — Patient Instructions (Signed)
.   Please review the attached list of medications and notify my office if there are any errors.   . Please bring all of your medications to every appointment so we can make sure that our medication list is the same as yours.   

## 2022-06-19 ENCOUNTER — Ambulatory Visit (INDEPENDENT_AMBULATORY_CARE_PROVIDER_SITE_OTHER): Payer: PPO

## 2022-06-19 VITALS — Ht 72.0 in | Wt 177.0 lb

## 2022-06-19 DIAGNOSIS — Z1211 Encounter for screening for malignant neoplasm of colon: Secondary | ICD-10-CM

## 2022-06-19 DIAGNOSIS — Z0000 Encounter for general adult medical examination without abnormal findings: Secondary | ICD-10-CM | POA: Diagnosis not present

## 2022-06-19 NOTE — Patient Instructions (Signed)
Richard Lamb , Thank you for taking time to come for your Medicare Wellness Visit. I appreciate your ongoing commitment to your health goals. Please review the following plan we discussed and let me know if I can assist you in the future.   These are the goals we discussed:  Goals      DIET - EAT MORE FRUITS AND VEGETABLES     Quit Smoking     Recommend to continue efforts to reduce smoking habits until no longer smoking.         This is a list of the screening recommended for you and due dates:  Health Maintenance  Topic Date Due   DTaP/Tdap/Td vaccine (1 - Tdap) Never done   Colon Cancer Screening  Never done   Screening for Lung Cancer  Never done   Zoster (Shingles) Vaccine (1 of 2) Never done   COVID-19 Vaccine (4 - 2023-24 season) 01/25/2022   Medicare Annual Wellness Visit  06/20/2023   Pneumonia Vaccine  Completed   Flu Shot  Completed   Hepatitis C Screening: USPSTF Recommendation to screen - Ages 18-79 yo.  Completed   HPV Vaccine  Aged Out    Advanced directives: yes  Conditions/risks identified: none  Next appointment: Follow up in one year for your annual wellness visit. 06/24/2023@ 2pm via telephone  Preventive Care 70 Years and Older, Male  Preventive care refers to lifestyle choices and visits with your health care provider that can promote health and wellness. What does preventive care include? A yearly physical exam. This is also called an annual well check. Dental exams once or twice a year. Routine eye exams. Ask your health care provider how often you should have your eyes checked. Personal lifestyle choices, including: Daily care of your teeth and gums. Regular physical activity. Eating a healthy diet. Avoiding tobacco and drug use. Limiting alcohol use. Practicing safe sex. Taking low doses of aspirin every day. Taking vitamin and mineral supplements as recommended by your health care provider. What happens during an annual well check? The  services and screenings done by your health care provider during your annual well check will depend on your age, overall health, lifestyle risk factors, and family history of disease. Counseling  Your health care provider may ask you questions about your: Alcohol use. Tobacco use. Drug use. Emotional well-being. Home and relationship well-being. Sexual activity. Eating habits. History of falls. Memory and ability to understand (cognition). Work and work Statistician. Screening  You may have the following tests or measurements: Height, weight, and BMI. Blood pressure. Lipid and cholesterol levels. These may be checked every 5 years, or more frequently if you are over 70 years old. Skin check. Lung cancer screening. You may have this screening every year starting at age 70 if you have a 30-pack-year history of smoking and currently smoke or have quit within the past 15 years. Fecal occult blood test (FOBT) of the stool. You may have this test every year starting at age 70. Flexible sigmoidoscopy or colonoscopy. You may have a sigmoidoscopy every 5 years or a colonoscopy every 10 years starting at age 70. Prostate cancer screening. Recommendations will vary depending on your family history and other risks. Hepatitis C blood test. Hepatitis B blood test. Sexually transmitted disease (STD) testing. Diabetes screening. This is done by checking your blood sugar (glucose) after you have not eaten for a while (fasting). You may have this done every 1-3 years. Abdominal aortic aneurysm (AAA) screening. You may need  this if you are a current or former smoker. Osteoporosis. You may be screened starting at age 70 if you are at high risk. Talk with your health care provider about your test results, treatment options, and if necessary, the need for more tests. Vaccines  Your health care provider may recommend certain vaccines, such as: Influenza vaccine. This is recommended every year. Tetanus,  diphtheria, and acellular pertussis (Tdap, Td) vaccine. You may need a Td booster every 10 years. Zoster vaccine. You may need this after age 70. Pneumococcal 13-valent conjugate (PCV13) vaccine. One dose is recommended after age 70. Pneumococcal polysaccharide (PPSV23) vaccine. One dose is recommended after age 70. Talk to your health care provider about which screenings and vaccines you need and how often you need them. This information is not intended to replace advice given to you by your health care provider. Make sure you discuss any questions you have with your health care provider. Document Released: 06/09/2015 Document Revised: 01/31/2016 Document Reviewed: 03/14/2015 Elsevier Interactive Patient Education  2017 [place] Prevention in the Home Falls can cause injuries. They can happen to people of all ages. There are many things you can do to make your home safe and to help prevent falls. What can I do on the outside of my home? Regularly fix the edges of walkways and driveways and fix any cracks. Remove anything that might make you trip as you walk through a door, such as a raised step or threshold. Trim any bushes or trees on the path to your home. Use bright outdoor lighting. Clear any walking paths of anything that might make someone trip, such as rocks or tools. Regularly check to see if handrails are loose or broken. Make sure that both sides of any steps have handrails. Any raised decks and porches should have guardrails on the edges. Have any leaves, snow, or ice cleared regularly. Use sand or salt on walking paths during winter. Clean up any spills in your garage right away. This includes oil or grease spills. What can I do in the bathroom? Use night lights. Install grab bars by the toilet and in the tub and shower. Do not use towel bars as grab bars. Use non-skid mats or decals in the tub or shower. If you need to sit down in the shower, use a plastic,  non-slip stool. Keep the floor dry. Clean up any water that spills on the floor as soon as it happens. Remove soap buildup in the tub or shower regularly. Attach bath mats securely with double-sided non-slip rug tape. Do not have throw rugs and other things on the floor that can make you trip. What can I do in the bedroom? Use night lights. Make sure that you have a light by your bed that is easy to reach. Do not use any sheets or blankets that are too big for your bed. They should not hang down onto the floor. Have a firm chair that has side arms. You can use this for support while you get dressed. Do not have throw rugs and other things on the floor that can make you trip. What can I do in the kitchen? Clean up any spills right away. Avoid walking on wet floors. Keep items that you use a lot in easy-to-reach places. If you need to reach something above you, use a strong step stool that has a grab bar. Keep electrical cords out of the way. Do not use floor polish or wax that makes floors  slippery. If you must use wax, use non-skid floor wax. Do not have throw rugs and other things on the floor that can make you trip. What can I do with my stairs? Do not leave any items on the stairs. Make sure that there are handrails on both sides of the stairs and use them. Fix handrails that are broken or loose. Make sure that handrails are as long as the stairways. Check any carpeting to make sure that it is firmly attached to the stairs. Fix any carpet that is loose or worn. Avoid having throw rugs at the top or bottom of the stairs. If you do have throw rugs, attach them to the floor with carpet tape. Make sure that you have a light switch at the top of the stairs and the bottom of the stairs. If you do not have them, ask someone to add them for you. What else can I do to help prevent falls? Wear shoes that: Do not have high heels. Have rubber bottoms. Are comfortable and fit you well. Are closed  at the toe. Do not wear sandals. If you use a stepladder: Make sure that it is fully opened. Do not climb a closed stepladder. Make sure that both sides of the stepladder are locked into place. Ask someone to hold it for you, if possible. Clearly mark and make sure that you can see: Any grab bars or handrails. First and last steps. Where the edge of each step is. Use tools that help you move around (mobility aids) if they are needed. These include: Canes. Walkers. Scooters. Crutches. Turn on the lights when you go into a dark area. Replace any light bulbs as soon as they burn out. Set up your furniture so you have a clear path. Avoid moving your furniture around. If any of your floors are uneven, fix them. If there are any pets around you, be aware of where they are. Review your medicines with your doctor. Some medicines can make you feel dizzy. This can increase your chance of falling. Ask your doctor what other things that you can do to help prevent falls. This information is not intended to replace advice given to you by your health care provider. Make sure you discuss any questions you have with your health care provider. Document Released: 03/09/2009 Document Revised: 10/19/2015 Document Reviewed: 06/17/2014 Elsevier Interactive Patient Education  2017 [employer].

## 2022-06-19 NOTE — Progress Notes (Signed)
Virtual Visit via Telephone Note  I connected with  Richard Lamb on 06/19/22 at  1:30 PM EST by telephone and verified that I am speaking with the correct person using two identifiers.  Location: Patient: none Provider: [facility] Persons participating in the virtual visit: patient/Nurse Health Advisor   I discussed the limitations, risks, security and privacy concerns of performing an evaluation and management service by telephone and the availability of in person appointments. The patient expressed understanding and agreed to proceed.  Interactive audio and video telecommunications were attempted between this nurse and patient, however failed, due to patient having technical difficulties OR patient did not have access to video capability.  We continued and completed visit with audio only.  Some vital signs may be absent or patient reported.   Roger Shelter, LPN  Subjective:   Richard Lamb is a 72 y.o. male who presents for Medicare Annual/Subsequent preventive examination.  Review of Systems    no Cardiac Risk Factors include: advanced age (>64men, >33 women);male gender;hypertension;smoking/ tobacco exposure     Objective:    Today's Vitals   06/19/22 1338  Weight: 177 lb (80.3 kg)  Height: 6' (1.829 m)   Body mass index is 24.01 kg/m.     06/19/2022    1:51 PM 06/18/2021    1:21 PM 06/12/2020    1:27 PM 01/30/2020    1:22 PM 06/30/2018    7:49 AM 06/16/2018    9:26 AM  Advanced Directives  Does Patient Have a Medical Advance Directive? Yes No No No No No  Type of Paramedic of [place];Living will       Does patient want to make changes to medical advance directive? Yes (MAU/Ambulatory/Procedural Areas - Information given)       Copy of [place] in Chart? No - copy requested       Would patient like information on creating a medical advance directive? Yes (MAU/Ambulatory/Procedural Areas - Information given) No -  Patient declined No - Patient declined No - Patient declined No - Patient declined     Current Medications (verified) Outpatient Encounter Medications as of 06/19/2022  Medication Sig   amLODipine (NORVASC) 10 MG tablet TAKE 1 TABLET BY MOUTH EVERY DAY   atorvastatin (LIPITOR) 10 MG tablet TAKE 1 TABLET BY MOUTH EVERY DAY   lisinopril (ZESTRIL) 40 MG tablet Take 1 tablet (40 mg total) by mouth daily.   metoprolol succinate (TOPROL-XL) 25 MG 24 hr tablet Take 1 tablet (25 mg total) by mouth daily.   sildenafil (VIAGRA) 100 MG tablet Take 0.5-1 tablets (50-100 mg total) by mouth daily as needed for erectile dysfunction.   No facility-administered encounter medications on file as of 06/19/2022.    Allergies (verified) Sulfa antibiotics   History: Past Medical History:  Diagnosis Date   Anxiety    GERD (gastroesophageal reflux disease)    Hyperlipidemia    Hypertension    Past Surgical History:  Procedure Laterality Date   CYSTOSCOPY WITH LITHOLAPAXY N/A 06/30/2018   Procedure: CYSTOSCOPY WITH LITHOLAPAXY;  Surgeon: Abbie Sons, MD;  Location: [facility] ORS;  Service: Urology;  Laterality: N/A;   PROSTATE SURGERY     Dr.Wolfe   Family History  Problem Relation Age of Onset   Hypertension Mother    Hypertension Father    Stroke Brother    Hyperlipidemia Brother    Social History   Socioeconomic History   Marital status: Single    Spouse name: Not  on file   Number of children: 3   Years of education: Not on file   Highest education level: High school graduate  Occupational History    Comment: full time  Tobacco Use   Smoking status: Every Day    Packs/day: 0.50    Types: Cigarettes   Smokeless tobacco: Never   Tobacco comments:    < than half pack a day.   Vaping Use   Vaping Use: Never used  Substance and Sexual Activity   Alcohol use: Yes    Alcohol/week: 7.0 standard drinks of alcohol    Types: 7 Cans of beer per week    Comment: 1 beer a day   Drug use: Yes     Types: Marijuana   Sexual activity: Yes    Birth control/protection: None  Other Topics Concern   Not on file  Social History Narrative   Not on file   Social Determinants of Health   Financial Resource Strain: Low Risk  (06/19/2022)   Overall Financial Resource Strain (CARDIA)    Difficulty of Paying Living Expenses: Not hard at all  Food Insecurity: No Food Insecurity (06/19/2022)   Hunger Vital Sign    Worried About Running Out of Food in the Last Year: Never true    Ran Out of Food in the Last Year: Never true  Transportation Needs: No Transportation Needs (06/19/2022)   PRAPARE - Hydrologist (Medical): No    Lack of Transportation (Non-Medical): No  Physical Activity: Sufficiently Active (06/19/2022)   Exercise Vital Sign    Days of Exercise per Week: 5 days    Minutes of Exercise per Session: 60 min  Stress: No Stress Concern Present (06/19/2022)   [place]    Feeling of Stress : Not at all  Social Connections: Moderately Isolated (06/19/2022)   Social Connection and Isolation Panel [NHANES]    Frequency of Communication with Friends and Family: Twice a week    Frequency of Social Gatherings with Friends and Family: Twice a week    Attends Religious Services: More than 4 times per year    Active Member of [employer] or Organizations: No    Attends Music therapist: Never    Marital Status: Never married    Tobacco Counseling Smokes cigars  not ready to quit  Clinical Intake:  Pre-visit preparation completed: Yes  Pain : No/denies pain   BMI - recorded: 24.01 Nutritional Status: BMI of 19-24  Normal Nutritional Risks: None Diabetes: No  How often do you need to have someone help you when you read instructions, pamphlets, or other written materials from your doctor or pharmacy?: 1 - Never  Diabetic?no  Interpreter Needed?: No  Information entered by ::  B.Johnrobert Foti,LPN   Activities of Daily Living    06/19/2022    1:51 PM 10/30/2021    3:39 PM  In your present state of health, do you have any difficulty performing the following activities:  Hearing? 0 0  Vision? 0 0  Difficulty concentrating or making decisions? 0 0  Walking or climbing stairs? 0 0  Dressing or bathing? 0 0  Doing errands, shopping? 0 0  Preparing Food and eating ? N   Using the Toilet? N   In the past six months, have you accidently leaked urine? N   Do you have problems with loss of bowel control? N   Managing your Medications? N   Managing  your Finances? N   Housekeeping or managing your Housekeeping? N     Patient Care Team: Jacky Kindle, FNP as PCP - General (Family Medicine)  Indicate any recent Medical Services you may have received from other than [facility] providers in the past year (date may be approximate).     Assessment:   This is a routine wellness examination for [facility].  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        Depression Screen    06/19/2022    1:45 PM 10/30/2021    3:38 PM 06/18/2021    1:17 PM 05/16/2021    2:02 PM 07/25/2020    8:11 AM 06/12/2020    1:24 PM 04/25/2020    8:18 AM  PHQ 2/9 Scores  PHQ - 2 Score 0 0 0 0 0 0 0  PHQ- 9 Score  0 0 0 0  0    Fall Risk    06/19/2022    1:43 PM 10/30/2021    3:38 PM 06/18/2021    1:23 PM 07/25/2020    8:11 AM 06/12/2020     1:27 PM  Fall Risk   Falls in the past year? 0 0 0 0 0  Number falls in past yr: 0 0 0 0 0  Injury with Fall? 0 0 0 0 0  Risk for fall due to : No Fall Risks  No Fall Risks No Fall Risks   Follow up Education provided;Falls prevention discussed Falls evaluation completed Falls evaluation completed Falls evaluation completed     FALL RISK PREVENTION PERTAINING TO THE HOME:  Any stairs in or around the home? Yes outside If so, are there any without handrails? Yes  Home free of loose throw rugs in walkways, pet beds, electrical cords, etc? Yes  Adequate lighting in your home to reduce risk of falls? Yes   ASSISTIVE DEVICES UTILIZED TO PREVENT FALLS:  Life alert? No  Use of a cane, walker or w/c? No  Grab bars in the bathroom? Yes  Shower chair or bench in shower? No  Elevated toilet seat or a handicapped toilet? No    Immunizations Immunization History  Administered Date(s) Administered   Fluad Quad(high Dose 65+) 03/30/2019, 03/16/2020, 03/06/2021   Influenza,inj,Quad PF,6+ Mos 04/07/2018   Influenza-Unspecified 03/17/2015, 03/04/2022   PFIZER(Purple Top)SARS-COV-2 Vaccination 07/03/2019, 07/27/2019, 04/25/2020   Pneumococcal Conjugate-13 12/28/2018   Pneumococcal Polysaccharide-23 01/25/2020    TDAP status: Due, Education has been provided regarding the importance of this vaccine. Advised may receive this vaccine at local pharmacy or Health Dept. Aware to provide a copy of the vaccination record if obtained from local pharmacy or Health Dept. Verbalized acceptance and understanding.  Flu Vaccine status: Up to date  Pneumococcal vaccine status: Up to date  Covid-19 vaccine status: Completed vaccines  Qualifies for Shingles Vaccine? Yes   Zostavax completed No   Shingrix Completed?: No.  Education has been provided regarding the importance of this vaccine. Patient has been advised to call insurance company to determine out of pocket expense if they have not yet received  this vaccine. Advised may also receive vaccine at local pharmacy or Health Dept. Verbalized acceptance and understanding.  Screening Tests Health Maintenance  Topic Date Due   DTaP/Tdap/Td (1 - Tdap) Never done   COLONOSCOPY (Pts 45-68yrs Insurance coverage will need to be confirmed)  Never done   Lung Cancer Screening  Never done   Zoster Vaccines- Shingrix (1 of 2) Never done   COVID-19 Vaccine (4 - 2023-24 season) 01/25/2022   Medicare Annual Wellness (AWV)  06/20/2023   Pneumonia Vaccine 31+ Years old  Completed   INFLUENZA VACCINE  Completed   Hepatitis C Screening  Completed   HPV VACCINES  Aged Out    Health Maintenance  Health Maintenance Due  Topic Date Due   DTaP/Tdap/Td (1 - Tdap) Never done   COLONOSCOPY (Pts 45-61yrs Insurance coverage will need to be confirmed)  Never done   Lung Cancer Screening  Never done   Zoster Vaccines- Shingrix (1 of 2) Never done   COVID-19 Vaccine (4 - 2023-24 season) 01/25/2022    Colorectal cancer screening: Referral to GI placed yes. Pt aware the office will call re: appt.  Lung Cancer Screening: (Low Dose CT Chest recommended if Age 64-80 years, 30 pack-year currently smoking OR have quit w/in 15years.) does qualify.   Lung Cancer Screening Referral: no, pt declines  Additional Screening:  Hepatitis C Screening: does qualify; Completed 10/30/2021  Vision Screening: Recommended annual ophthalmology exams for early detection of glaucoma and other disorders of the eye. Is the patient up to date with their annual eye exam?  Yes  Who is the provider or what is the name of the office in which the patient attends annual eye exams?  [facility] If pt is not established with a provider, would they like to be referred to a provider to establish care? No .   Dental Screening: Recommended annual dental exams for proper oral hygiene  Community Resource Referral / Chronic Care Management: CRR required this visit?  No   CCM  required this visit?  No      Plan:     I have personally reviewed and noted the following in the patient's chart:   Medical and social history Use of alcohol, tobacco or illicit drugs  Current medications and supplements including opioid prescriptions. Patient is not currently taking opioid prescriptions. Functional ability and status Nutritional status Physical activity Advanced directives List of other physicians Hospitalizations, surgeries, and ER visits in previous 12 months Vitals Screenings to include cognitive, depression, and falls Referrals and appointments  In addition, I have reviewed and discussed with patient certain preventive protocols, quality metrics, and best practice recommendations. A written personalized care plan for preventive services as well as general preventive health recommendations were provided to patient.     Sue Lush, LPN   6/38/[id number]   Nurse Notes: Order for colonoscopy placed

## 2022-07-15 ENCOUNTER — Other Ambulatory Visit: Payer: Self-pay | Admitting: Family Medicine

## 2022-07-15 DIAGNOSIS — I10 Essential (primary) hypertension: Secondary | ICD-10-CM

## 2022-11-04 NOTE — Progress Notes (Unsigned)
I,Richard Lamb,acting as a scribe for Richard Merry, MD.,have documented all relevant documentation on the behalf of Richard Merry, MD,as directed by  Richard Merry, MD while in the presence of Richard Merry, MD.    Complete physical exam   Patient: Richard Lamb   DOB: [birth year]   72 y.o. Male  MRN: [id number] Visit Date: 11/05/2022  Today's healthcare provider: [name], MD   No chief complaint on file.  Subjective    Richard Lamb is a 72 y.o. male who presents today for a complete physical exam.  He reports consuming a {diet types:17450} diet. {Exercise:19826} He generally feels {well/fairly well/poorly:18703}. He reports sleeping {well/fairly well/poorly:18703}. He {does/does not:200015} have additional problems to discuss today.  HPI  06/19/22 AWV  Past Medical History:  Diagnosis Date   Anxiety    GERD (gastroesophageal reflux disease)    Hyperlipidemia    Hypertension    Past Surgical History:  Procedure Laterality Date   CYSTOSCOPY WITH LITHOLAPAXY N/A 06/30/2018   Procedure: CYSTOSCOPY WITH LITHOLAPAXY;  Surgeon: Riki Altes, MD;  Location: [facility] ORS;  Service: Urology;  Laterality: N/A;   PROSTATE SURGERY     Dr.Wolfe   Social History   Socioeconomic History   Marital status: Single    Spouse name: Not on file   Number of children: 3   Years of education: Not on file   Highest education level: High school graduate  Occupational History    Comment: full time  Tobacco Use   Smoking status: Every Day    Packs/day: .5    Types: Cigarettes   Smokeless tobacco: Never   Tobacco comments:    < than half pack a day.   Vaping Use   Vaping Use: Never used  Substance and Sexual Activity   Alcohol use: Yes    Alcohol/week: 7.0 standard drinks of alcohol    Types: 7 Cans of beer per week    Comment: 1 beer a day   Drug use: Yes    Types: Marijuana   Sexual activity: Yes    Birth control/protection: None  Other Topics Concern   Not  on file  Social History Narrative   Not on file   Social Determinants of Health   Financial Resource Strain: Low Risk  (06/19/2022)   Overall Financial Resource Strain (CARDIA)    Difficulty of Paying Living Expenses: Not hard at all  Food Insecurity: No Food Insecurity (06/19/2022)   Hunger Vital Sign    Worried About Running Out of Food in the Last Year: Never true    Ran Out of Food in the Last Year: Never true  Transportation Needs: No Transportation Needs (06/19/2022)   PRAPARE - Administrator, Civil Service (Medical): No    Lack of Transportation (Non-Medical): No  Physical Activity: Sufficiently Active (06/19/2022)   Exercise Vital Sign    Days of Exercise per Week: 5 days    Minutes of Exercise per Session: 60 min  Stress: No Stress Concern Present (06/19/2022)   [employer] of Occupational Health - Occupational Stress Questionnaire    Feeling of Stress : Not at all  Social Connections: Moderately Isolated (06/19/2022)   Social Connection and Isolation Panel [NHANES]    Frequency of Communication with Friends and Family: Twice a week    Frequency of Social Gatherings with Friends and Family: Twice a week    Attends Religious Services: More than 4 times per year    Active Member of  Clubs or Organizations: No    Attends Banker Meetings: Never    Marital Status: Never married  Intimate Partner Violence: Not At Risk (06/19/2022)   Humiliation, Afraid, Rape, and Kick questionnaire    Fear of Current or Ex-Partner: No    Emotionally Abused: No    Physically Abused: No    Sexually Abused: No   Family Status  Relation Name Status   Mother  Alive   Father  Deceased   Sister  Alive   Brother  Deceased   Family History  Problem Relation Age of Onset   Hypertension Mother    Hypertension Father    Stroke Brother    Hyperlipidemia Brother    Allergies  Allergen Reactions   Sulfa Antibiotics Anaphylaxis    Patient Care Team: Jacky Kindle,  FNP as PCP - General (Family Medicine)   Medications: Outpatient Medications Prior to Visit  Medication Sig   amLODipine (NORVASC) 10 MG tablet TAKE 1 TABLET BY MOUTH EVERY DAY   atorvastatin (LIPITOR) 10 MG tablet TAKE 1 TABLET BY MOUTH EVERY DAY   lisinopril (ZESTRIL) 40 MG tablet TAKE 1 TABLET BY MOUTH EVERY DAY   metoprolol succinate (TOPROL-XL) 25 MG 24 hr tablet Take 1 tablet (25 mg total) by mouth daily.   sildenafil (VIAGRA) 100 MG tablet Take 0.5-1 tablets (50-100 mg total) by mouth daily as needed for erectile dysfunction.   No facility-administered medications prior to visit.    Review of Systems  All other systems reviewed and are negative.   Last CBC Lab Results  Component Value Date   WBC 5.3 10/30/2021   HGB 17.4 10/30/2021   HCT 51.9 (H) 10/30/2021   MCV 92 10/30/2021   MCH 30.7 10/30/2021   RDW 12.4 10/30/2021   PLT 213 10/30/2021   Last metabolic panel Lab Results  Component Value Date   GLUCOSE 90 10/30/2021   NA 138 10/30/2021   K 5.2 10/30/2021   CL 99 10/30/2021   CO2 25 10/30/2021   BUN 18 10/30/2021   CREATININE 1.12 10/30/2021   EGFR 72 10/30/2021   CALCIUM 9.8 10/30/2021   PROT 7.0 10/30/2021   ALBUMIN 4.4 10/30/2021   LABGLOB 2.6 10/30/2021   AGRATIO 1.7 10/30/2021   BILITOT 0.5 10/30/2021   ALKPHOS 53 10/30/2021   AST 20 10/30/2021   ALT 18 10/30/2021   ANIONGAP 10 01/30/2020   Last lipids Lab Results  Component Value Date   CHOL 163 10/30/2021   HDL 58 10/30/2021   LDLCALC 93 10/30/2021   TRIG 63 10/30/2021   CHOLHDL 2.8 10/30/2021   Last hemoglobin A1c Lab Results  Component Value Date   HGBA1C 5.6 10/30/2021      Objective    There were no vitals taken for this visit. BP Readings from Last 3 Encounters:  04/30/22 (!) 145/78  10/30/21 134/88  06/18/21 130/78   Wt Readings from Last 3 Encounters:  06/19/22 177 lb (80.3 kg)  04/30/22 177 lb (80.3 kg)  10/30/21 173 lb 11.2 oz (78.8 kg)       Physical Exam   ***  Last depression screening scores    06/19/2022    1:45 PM 10/30/2021    3:38 PM 06/18/2021    1:17 PM  PHQ 2/9 Scores  PHQ - 2 Score 0 0 0  PHQ- 9 Score  0 0   Last fall risk screening    06/19/2022    1:43 PM  Fall Risk   Falls  in the past year? 0  Number falls in past yr: 0  Injury with Fall? 0  Risk for fall due to : No Fall Risks  Follow up Education provided;Falls prevention discussed   Last Audit-C alcohol use screening    06/19/2022    1:44 PM  Alcohol Use Disorder Test (AUDIT)  1. How often do you have a drink containing alcohol? 4  2. How many drinks containing alcohol do you have on a typical day when you are drinking? 0  3. How often do you have six or more drinks on one occasion? 0  AUDIT-C Score 4   A score of 3 or more in women, and 4 or more in men indicates increased risk for alcohol abuse, EXCEPT if all of the points are from question 1   No results found for any visits on 11/05/22.  Assessment & Plan    Routine Health Maintenance and Physical Exam  Exercise Activities and Dietary recommendations  Goals      DIET - EAT MORE FRUITS AND VEGETABLES     Quit Smoking     Recommend to continue efforts to reduce smoking habits until no longer smoking.         Immunization History  Administered Date(s) Administered   Fluad Quad(high Dose 65+) 03/30/2019, 03/16/2020, 03/06/2021   Influenza,inj,Quad PF,6+ Mos 04/07/2018   Influenza-Unspecified 03/17/2015, 03/04/2022   PFIZER(Purple Top)SARS-COV-2 Vaccination 07/03/2019, 07/27/2019, 04/25/2020   Pneumococcal Conjugate-13 12/28/2018   Pneumococcal Polysaccharide-23 01/25/2020    Health Maintenance  Topic Date Due   DTaP/Tdap/Td (1 - Tdap) Never done   Colonoscopy  Never done   Lung Cancer Screening  Never done   Zoster Vaccines- Shingrix (1 of 2) Never done   COVID-19 Vaccine (4 - 2023-24 season) 01/25/2022   INFLUENZA VACCINE  12/26/2022   Medicare Annual Wellness (AWV)  06/20/2023    Pneumonia Vaccine 56+ Years old  Completed   Hepatitis C Screening  Completed   HPV VACCINES  Aged Out    Discussed health benefits of physical activity, and encouraged him to engage in regular exercise appropriate for his age and condition.  ***  No follow-ups on file.     {provider attestation***:1}   Richard Merry, MD  [facility] [phone] (phone) [phone] (fax)  [facility]

## 2022-11-04 NOTE — Patient Instructions (Incomplete)
The CDC recommends two doses of Shingrix (the shingles vaccine) separated by 2 to 6 months for adults age 70 years and older. I recommend checking with your insurance plan regarding coverage for this vaccine.   Screening for lung cancer is recommended for people between 3 and 30 years of age who have smoked the equivalent of 1 pack per day for 20 years. Please call our office at [phone] to schedule a low dose CT lung scan for lung cancer screening.

## 2022-11-05 ENCOUNTER — Ambulatory Visit (INDEPENDENT_AMBULATORY_CARE_PROVIDER_SITE_OTHER): Payer: PPO | Admitting: Family Medicine

## 2022-11-05 ENCOUNTER — Encounter: Payer: Self-pay | Admitting: Family Medicine

## 2022-11-05 VITALS — BP 133/68 | HR 70 | Temp 98.3°F | Resp 24 | Ht 72.0 in | Wt 180.0 lb

## 2022-11-05 DIAGNOSIS — R739 Hyperglycemia, unspecified: Secondary | ICD-10-CM | POA: Diagnosis not present

## 2022-11-05 DIAGNOSIS — Z0000 Encounter for general adult medical examination without abnormal findings: Secondary | ICD-10-CM | POA: Diagnosis not present

## 2022-11-05 DIAGNOSIS — I10 Essential (primary) hypertension: Secondary | ICD-10-CM

## 2022-11-05 DIAGNOSIS — Z125 Encounter for screening for malignant neoplasm of prostate: Secondary | ICD-10-CM

## 2022-11-05 DIAGNOSIS — Z720 Tobacco use: Secondary | ICD-10-CM

## 2022-11-05 MED ORDER — SILDENAFIL CITRATE 100 MG PO TABS: 50.0000 mg | ORAL_TABLET | Freq: Every day | ORAL | 2 refills | Status: AC | PRN

## 2022-11-06 ENCOUNTER — Other Ambulatory Visit: Payer: Self-pay | Admitting: Family Medicine

## 2022-11-06 DIAGNOSIS — I10 Essential (primary) hypertension: Secondary | ICD-10-CM

## 2022-11-06 LAB — COMPREHENSIVE METABOLIC PANEL
ALT: 17 IU/L (ref 0–44)
AST: 20 IU/L (ref 0–40)
Albumin/Globulin Ratio: 2
Albumin: 4.2 g/dL (ref 3.9–4.9)
Alkaline Phosphatase: 50 IU/L (ref 44–121)
BUN/Creatinine Ratio: 14 (ref 10–24)
BUN: 14 mg/dL (ref 8–27)
Bilirubin Total: 0.5 mg/dL (ref 0.0–1.2)
CO2: 27 mmol/L (ref 20–29)
Calcium: 9.5 mg/dL (ref 8.6–10.2)
Chloride: 100 mmol/L (ref 96–106)
Creatinine, Ser: 1 mg/dL (ref 0.76–1.27)
Globulin, Total: 2.1 g/dL (ref 1.5–4.5)
Glucose: 97 mg/dL (ref 70–99)
Potassium: 4.7 mmol/L (ref 3.5–5.2)
Sodium: 139 mmol/L (ref 134–144)
Total Protein: 6.3 g/dL (ref 6.0–8.5)
eGFR: 81 mL/min/{1.73_m2} (ref >59)

## 2022-11-06 LAB — LIPID PANEL
Chol/HDL Ratio: 2.2 ratio (ref 0.0–5.0)
Cholesterol, Total: 152 mg/dL (ref 100–199)
HDL: 70 mg/dL (ref >39)
LDL Chol Calc (NIH): 72 mg/dL (ref 0–99)
Triglycerides: 46 mg/dL (ref 0–149)
VLDL Cholesterol Cal: 10 mg/dL (ref 5–40)

## 2022-11-06 LAB — CBC
Hematocrit: 47.8 % (ref 37.5–51.0)
Hemoglobin: 15.8 g/dL (ref 13.0–17.7)
MCH: 30.3 pg (ref 26.6–33.0)
MCHC: 33.1 g/dL (ref 31.5–35.7)
MCV: 92 fL (ref 79–97)
Platelets: 216 10*3/uL (ref 150–450)
RBC: 5.22 x10E6/uL (ref 4.14–5.80)
RDW: 11.9 % (ref 11.6–15.4)
WBC: 5.3 10*3/uL (ref 3.4–10.8)

## 2022-11-06 LAB — TSH: TSH: 0.92 u[IU]/mL (ref 0.450–4.500)

## 2022-11-06 LAB — PSA TOTAL (REFLEX TO FREE): Prostate Specific Ag, Serum: 2.6 ng/mL (ref 0.0–4.0)

## 2022-11-06 LAB — HEMOGLOBIN A1C
Est. average glucose Bld gHb Est-mCnc: 120 mg/dL
Hgb A1c MFr Bld: 5.8 % — ABNORMAL HIGH (ref 4.8–5.6)

## 2022-11-06 NOTE — Telephone Encounter (Signed)
Requested Prescriptions  Pending Prescriptions Disp Refills   metoprolol succinate (TOPROL-XL) 25 MG 24 hr tablet [Pharmacy Med Name: METOPROLOL SUCC ER 25 MG TAB] 90 tablet 3    Sig: TAKE 1 TABLET (25 MG TOTAL) BY MOUTH DAILY.     Cardiovascular:  Beta Blockers Passed - 11/06/2022  1:52 AM      Passed - Last BP in normal range    BP Readings from Last 1 Encounters:  11/05/22 133/68         Passed - Last Heart Rate in normal range    Pulse Readings from Last 1 Encounters:  11/05/22 70         Passed - Valid encounter within last 6 months    Recent Outpatient Visits           Yesterday Annual physical exam   [facility] Malva Limes, MD   6 months ago Primary hypertension   [facility] Malva Limes, MD   1 year ago Annual physical exam   [facility] Malva Limes, MD   1 year ago Unintended weight loss   [facility] Merita Norton T, FNP   2 years ago Essential hypertension   [facility] [name] [place], [place]

## 2023-02-02 ENCOUNTER — Other Ambulatory Visit: Payer: Self-pay | Admitting: Family Medicine

## 2023-02-02 DIAGNOSIS — I10 Essential (primary) hypertension: Secondary | ICD-10-CM

## 2023-02-04 ENCOUNTER — Other Ambulatory Visit: Payer: Self-pay | Admitting: Family Medicine

## 2023-02-04 DIAGNOSIS — I10 Essential (primary) hypertension: Secondary | ICD-10-CM

## 2023-05-01 ENCOUNTER — Other Ambulatory Visit: Payer: Self-pay | Admitting: Family Medicine

## 2023-05-01 DIAGNOSIS — I10 Essential (primary) hypertension: Secondary | ICD-10-CM

## 2023-05-06 ENCOUNTER — Other Ambulatory Visit: Payer: Self-pay | Admitting: Family Medicine

## 2023-05-06 DIAGNOSIS — I10 Essential (primary) hypertension: Secondary | ICD-10-CM

## 2023-05-06 DIAGNOSIS — E785 Hyperlipidemia, unspecified: Secondary | ICD-10-CM

## 2023-05-07 NOTE — Telephone Encounter (Signed)
Called patient to schedule appt for medication refills. Patient only off of work on Tuesdays and no available appt with PCP on a Tuesday . Please Richard Lamb if patient can get appt early in the day as possible on a Tuesday scheduled.

## 2023-05-07 NOTE — Telephone Encounter (Signed)
Requested by interface surescripts. Courtesy refill given. Patient requesting call back if appt can be scheduled on a Tuesday due to work issues. None available at this time.  Requested Prescriptions  Pending Prescriptions Disp Refills   metoprolol succinate (TOPROL-XL) 25 MG 24 hr tablet [Pharmacy Med Name: METOPROLOL SUCC ER 25 MG TAB] 90 tablet 0    Sig: TAKE 1 TABLET (25 MG TOTAL) BY MOUTH DAILY.     Cardiovascular:  Beta Blockers Failed - 05/06/2023 10:55 AM      Failed - Valid encounter within last 6 months    Recent Outpatient Visits           6 months ago Annual physical exam   [facility] Malva Limes, MD   1 year ago Primary hypertension   [facility] [facility] Malva Limes, MD   1 year ago Annual physical exam   [facility] Malva Limes, MD   1 year ago Unintended weight loss   [facility] Merita Norton T, FNP   2 years ago Essential hypertension   [facility] [name] M, [place]              Passed - Last BP in normal range    BP Readings from Last 1 Encounters:  11/05/22 133/68         Passed - Last Heart Rate in normal range    Pulse Readings from Last 1 Encounters:  11/05/22 70

## 2023-06-24 ENCOUNTER — Ambulatory Visit: Payer: PPO

## 2023-06-24 DIAGNOSIS — Z0000 Encounter for general adult medical examination without abnormal findings: Secondary | ICD-10-CM

## 2023-06-24 NOTE — Progress Notes (Signed)
Subjective:   Richard Lamb is a 73 y.o. male who presents for Medicare Annual/Subsequent preventive examination.  Visit Complete: Virtual I connected with  Richard Lamb on 06/24/23 by a audio enabled telemedicine application and verified that I am speaking with the correct person using two identifiers.  This patient declined Interactive audio and Acupuncturist. Therefore the visit was completed with audio only.  Patient Location: Home  Provider Location: Office/Clinic  I discussed the limitations of evaluation and management by telemedicine. The patient expressed understanding and agreed to proceed.  Vital Signs: Because this visit was a virtual/telehealth visit, some criteria may be missing or patient reported. Any vitals not documented were not able to be obtained and vitals that have been documented are patient reported.  Cardiac Risk Factors include: advanced age (>10men, >30 women);hypertension;male gender;smoking/ tobacco exposure     Objective:    There were no vitals filed for this visit. There is no height or weight on file to calculate BMI.     06/24/2023    2:35 PM 06/19/2022    1:51 PM 06/18/2021    1:21 PM 06/12/2020    1:27 PM 01/30/2020    1:22 PM 06/30/2018    7:49 AM 06/16/2018    9:26 AM  Advanced Directives  Does Patient Have a Medical Advance Directive? No Yes No No No No No  Type of Furniture conservator/restorer;Living will       Does patient want to make changes to medical advance directive?  Yes (MAU/Ambulatory/Procedural Areas - Information given)       Copy of Healthcare Power of Attorney in Chart?  No - copy requested       Would patient like information on creating a medical advance directive? No - Patient declined Yes (MAU/Ambulatory/Procedural Areas - Information given) No - Patient declined No - Patient declined No - Patient declined No - Patient declined     Current Medications (verified) Outpatient Encounter  Medications as of 06/24/2023  Medication Sig   amLODipine (NORVASC) 10 MG tablet TAKE 1 TABLET BY MOUTH EVERY DAY   atorvastatin (LIPITOR) 10 MG tablet TAKE 1 TABLET BY MOUTH EVERY DAY   lisinopril (ZESTRIL) 40 MG tablet TAKE 1 TABLET BY MOUTH EVERY DAY   metoprolol succinate (TOPROL-XL) 25 MG 24 hr tablet TAKE 1 TABLET (25 MG TOTAL) BY MOUTH DAILY.   sildenafil (VIAGRA) 100 MG tablet Take 0.5-1 tablets (50-100 mg total) by mouth daily as needed for erectile dysfunction.   No facility-administered encounter medications on file as of 06/24/2023.    Allergies (verified) Sulfa antibiotics   History: Past Medical History:  Diagnosis Date   Anxiety    GERD (gastroesophageal reflux disease)    Hyperlipidemia    Hypertension    Past Surgical History:  Procedure Laterality Date   CYSTOSCOPY WITH LITHOLAPAXY N/A 06/30/2018   Procedure: CYSTOSCOPY WITH LITHOLAPAXY;  Surgeon: Riki Altes, MD;  Location: [facility] ORS;  Service: Urology;  Laterality: N/A;   PROSTATE SURGERY     Dr.Wolfe   Family History  Problem Relation Age of Onset   Hypertension Mother    Hypertension Father    Stroke Brother    Hyperlipidemia Brother    Social History   Socioeconomic History   Marital status: Single    Spouse name: Not on file   Number of children: 3   Years of education: Not on file   Highest education level: High school graduate  Occupational History  Comment: full time  Tobacco Use   Smoking status: Every Day    Current packs/day: 0.50    Types: Cigarettes   Smokeless tobacco: Never   Tobacco comments:    < than half pack a day.   Vaping Use   Vaping status: Never Used  Substance and Sexual Activity   Alcohol use: Yes    Alcohol/week: 7.0 standard drinks of alcohol    Types: 7 Cans of beer per week    Comment: 1 beer a day   Drug use: Yes    Types: Marijuana   Sexual activity: Yes    Birth control/protection: None  Other Topics Concern   Not on file  Social History Narrative    Not on file   Social Drivers of Health   Financial Resource Strain: Low Risk  (06/24/2023)   Overall Financial Resource Strain (CARDIA)    Difficulty of Paying Living Expenses: Not very hard  Food Insecurity: No Food Insecurity (06/24/2023)   Hunger Vital Sign    Worried About Running Out of Food in the Last Year: Never true    Ran Out of Food in the Last Year: Never true  Transportation Needs: No Transportation Needs (06/24/2023)   PRAPARE - Administrator, Civil Service (Medical): No    Lack of Transportation (Non-Medical): No  Physical Activity: Sufficiently Active (06/24/2023)   Exercise Vital Sign    Days of Exercise per Week: 6 days    Minutes of Exercise per Session: 60 min  Stress: No Stress Concern Present (06/24/2023)   [employer] of Occupational Health - Occupational Stress Questionnaire    Feeling of Stress : Not at all  Social Connections: Moderately Isolated (06/24/2023)   Social Connection and Isolation Panel [NHANES]    Frequency of Communication with Friends and Family: More than three times a week    Frequency of Social Gatherings with Friends and Family: More than three times a week    Attends Religious Services: More than 4 times per year    Active Member of [employer] or Organizations: No    Attends Banker Meetings: Never    Marital Status: Never married    Tobacco Counseling Ready to quit: Not Answered Counseling given: Not Answered Tobacco comments: < than half pack a day.    Clinical Intake:  Pre-visit preparation completed: Yes  Pain : No/denies pain     BMI - recorded: 24.4 Nutritional Status: BMI of 19-24  Normal Nutritional Risks: None Diabetes: No  How often do you need to have someone help you when you read instructions, pamphlets, or other written materials from your doctor or pharmacy?: 1 - Never  Interpreter Needed?: No  Information entered by :: Kennedy Bucker, LPN   Activities of Daily Living     06/24/2023    2:36 PM 11/05/2022    9:06 AM  In your present state of health, do you have any difficulty performing the following activities:  Hearing? 0 0  Vision? 0 0  Difficulty concentrating or making decisions? 0 0  Walking or climbing stairs? 0 0  Dressing or bathing? 0 0  Doing errands, shopping? 0 0  Preparing Food and eating ? N   Using the Toilet? N   In the past six months, have you accidently leaked urine? N   Do you have problems with loss of bowel control? N   Managing your Medications? N   Managing your Finances? N   Housekeeping or managing your Housekeeping?  N     Patient Care Team: Jacky Kindle, FNP (Inactive) as PCP - General (Family Medicine)  Indicate any recent Medical Services you may have received from other than [facility] providers in the past year (date may be approximate).     Assessment:   This is a routine wellness examination for [facility].  Hearing/Vision screen Hearing Screening - Comments:: NO AIDS Vision Screening - Comments:: READERS-    Goals Addressed             This Visit's Progress    DIET - INCREASE WATER INTAKE         Depression Screen    06/24/2023    2:33 PM 11/05/2022    9:06 AM 06/19/2022    1:45 PM 10/30/2021    3:38 PM 06/18/2021    1:17 PM 05/16/2021    2:02 PM 07/25/2020    8:11 AM  PHQ 2/9 Scores  PHQ - 2 Score 1 0 0 0 0 0 0  PHQ- 9 Score 1 0  0 0 0 0    Fall Risk    06/24/2023    2:36 PM 11/05/2022    9:06 AM 06/19/2022    1:43 PM 10/30/2021    3:38 PM 06/18/2021    1:23 PM  Fall Risk   Falls in the past year? 0 0 0 0 0  Number falls in past yr: 0 0 0 0 0  Injury with Fall? 0 0 0 0 0  Risk for fall due to : No Fall Risks No Fall Risks No Fall Risks  No Fall Risks  Follow up Falls prevention discussed;Falls evaluation completed Falls evaluation completed Education provided;Falls prevention discussed Falls evaluation completed Falls evaluation completed    MEDICARE RISK AT HOME: Medicare Risk at Home Any stairs in  or around the home?: No If so, are there any without handrails?: No Home free of loose throw rugs in walkways, pet beds, electrical cords, etc?: Yes Adequate lighting in your home to reduce risk of falls?: Yes Life alert?: No Use of a cane, walker or w/c?: No Grab bars in the bathroom?: No Shower chair or bench in shower?: Yes Elevated toilet seat or a handicapped toilet?: No  TIMED UP AND GO:  Was the test performed?  No    Cognitive Function:        06/24/2023    2:37 PM 06/19/2022    1:52 PM  6CIT Screen  What Year? 0 points 0 points  What month? 0 points 0 points  What time? 0 points 0 points  Count back from 20 0 points 0 points  Months in reverse 4 points 0 points  Repeat phrase 2 points 4 points  Total Score 6 points 4 points    Immunizations Immunization History  Administered Date(s) Administered   Fluad Quad(high Dose 65+) 03/30/2019, 03/16/2020, 03/06/2021   Influenza,inj,Quad PF,6+ Mos 04/07/2018   Influenza-Unspecified 03/17/2015, 03/04/2022, 03/27/2023   PFIZER(Purple Top)SARS-COV-2 Vaccination 07/03/2019, 07/27/2019, 04/25/2020   Pneumococcal Conjugate-13 12/28/2018   Pneumococcal Polysaccharide-23 01/25/2020    TDAP status: Due, Education has been provided regarding the importance of this vaccine. Advised may receive this vaccine at local pharmacy or Health Dept. Aware to provide a copy of the vaccination record if obtained from local pharmacy or Health Dept. Verbalized acceptance and understanding.  Flu Vaccine status: Up to date  Pneumococcal vaccine status: Up to date  Covid-19 vaccine status: Completed vaccines  Qualifies for Shingles Vaccine? Yes   Zostavax completed No  Shingrix Completed?: No.    Education has been provided regarding the importance of this vaccine. Patient has been advised to call insurance company to determine out of pocket expense if they have not yet received this vaccine. Advised may also receive vaccine at local pharmacy  or Health Dept. Verbalized acceptance and understanding.  Screening Tests Health Maintenance  Topic Date Due   DTaP/Tdap/Td (1 - Tdap) Never done   Colonoscopy  Never done   Lung Cancer Screening  Never done   Zoster Vaccines- Shingrix (1 of 2) Never done   COVID-19 Vaccine (4 - 2024-25 season) 01/26/2023   Medicare Annual Wellness (AWV)  06/23/2024   Pneumonia Vaccine 18+ Years old  Completed   INFLUENZA VACCINE  Completed   Hepatitis C Screening  Completed   HPV VACCINES  Aged Out    Health Maintenance  Health Maintenance Due  Topic Date Due   DTaP/Tdap/Td (1 - Tdap) Never done   Colonoscopy  Never done   Lung Cancer Screening  Never done   Zoster Vaccines- Shingrix (1 of 2) Never done   COVID-19 Vaccine (4 - 2024-25 season) 01/26/2023    DECLINED REFERRAL FOR COLONOSCOPY  Lung Cancer Screening: (Low Dose CT Chest recommended if Age 25-80 years, 20 pack-year currently smoking OR have quit w/in 15years.) does not qualify.     Additional Screening:  Hepatitis C Screening: does qualify; Completed 10/30/21  Vision Screening: Recommended annual ophthalmology exams for early detection of glaucoma and other disorders of the eye. Is the patient up to date with their annual eye exam?  No  Who is the provider or what is the name of the office in which the patient attends annual eye exams? NO ONE If pt is not established with a provider, would they like to be referred to a provider to establish care? No .   Dental Screening: Recommended annual dental exams for proper oral hygiene   Community Resource Referral / Chronic Care Management: CRR required this visit?  No   CCM required this visit?  No     Plan:     I have personally reviewed and noted the following in the patient's chart:   Medical and social history Use of alcohol, tobacco or illicit drugs  Current medications and supplements including opioid prescriptions. Patient is not currently taking opioid  prescriptions. Functional ability and status Nutritional status Physical activity Advanced directives List of other physicians Hospitalizations, surgeries, and ER visits in previous 12 months Vitals Screenings to include cognitive, depression, and falls Referrals and appointments  In addition, I have reviewed and discussed with patient certain preventive protocols, quality metrics, and best practice recommendations. A written personalized care plan for preventive services as well as general preventive health recommendations were provided to patient.     Hal Hope, LPN   [id number]   After Visit Summary: (MyChart) Due to this being a telephonic visit, the after visit summary with patients personalized plan was offered to patient via MyChart   Nurse Notes: NONE

## 2023-06-24 NOTE — Patient Instructions (Addendum)
Mr. Kubisiak , Thank you for taking time to come for your Medicare Wellness Visit. I appreciate your ongoing commitment to your health goals. Please review the following plan we discussed and let me know if I can assist you in the future.   Referrals/Orders/Follow-Ups/Clinician Recommendations: NONE  This is a list of the screening recommended for you and due dates:  Health Maintenance  Topic Date Due   DTaP/Tdap/Td vaccine (1 - Tdap) Never done   Colon Cancer Screening  Never done   Screening for Lung Cancer  Never done   Zoster (Shingles) Vaccine (1 of 2) Never done   COVID-19 Vaccine (4 - 2024-25 season) 01/26/2023   Medicare Annual Wellness Visit  06/23/2024   Pneumonia Vaccine  Completed   Flu Shot  Completed   Hepatitis C Screening  Completed   HPV Vaccine  Aged Out    Advanced directives: (ACP Link)Information on Advanced Care Planning can be found at [facility] of [place] Advance Health Care Directives Advance Health Care Directives ([email or web address]   Next Medicare Annual Wellness Visit scheduled for next year: Yes   06/29/24 @ 1:10 PM BY PHONE

## 2023-08-02 ENCOUNTER — Other Ambulatory Visit: Payer: Self-pay | Admitting: Family Medicine

## 2023-08-02 DIAGNOSIS — I10 Essential (primary) hypertension: Secondary | ICD-10-CM

## 2023-09-04 ENCOUNTER — Ambulatory Visit: Payer: Self-pay

## 2023-09-04 NOTE — Telephone Encounter (Signed)
 Copied from CRM [id number]. Topic: Clinical - Red Word Triage >> Sep 04, 2023  9:11 AM Marissa P wrote: Red Word that prompted transfer to Nurse Triage:  Patient called in experiencing pain in left leg need a an appt or to be seen today if possible please.  Chief Complaint: left leg pain Symptoms: pain Frequency: comes and goes Pertinent Negatives: Patient denies fever, sob, pe hx, numbness, tingling Disposition: [] ED /[] Urgent Care (no appt availability in office) / [x] Appointment(In office/virtual)/ []  [facility]/ [] Home Care/ [] Refused Recommended Disposition /[] [place] [facility]/ []  Follow-up with PCP Additional Notes: per protocol apt made for tomorrow am.  Care advice given, denies questions; instructed to go to ER if becomes worse.  Reason for Disposition  [1] SEVERE pain (e.g., excruciating, unable to do any normal activities) AND [2] not improved after 2 hours of pain medicine  Answer Assessment - Initial Assessment Questions 1. ONSET: "When did the pain start?"      About a week ago  2. LOCATION: "Where is the pain located?"      Left leg 3. PAIN: "How bad is the pain?"    (Scale 1-10; or mild, moderate, severe)   -  MILD (1-3): doesn't interfere with normal activities    -  MODERATE (4-7): interferes with normal activities (e.g., work or school) or awakens from sleep, limping    -  SEVERE (8-10): excruciating pain, unable to do any normal activities, unable to walk     5/10 4. WORK OR EXERCISE: "Has there been any recent work or exercise that involved this part of the body?"      denies 5. CAUSE: "What do you think is causing the leg pain?"     unknown 6. OTHER SYMPTOMS: "Do you have any other symptoms?" (e.g., chest pain, back pain, breathing difficulty, swelling, rash, fever, numbness, weakness)     Back pain 7. PREGNANCY: "Is there any chance you are pregnant?" "When was your last menstrual period?"     na  Protocols used: Leg Pain-A-AH

## 2023-09-05 ENCOUNTER — Ambulatory Visit (INDEPENDENT_AMBULATORY_CARE_PROVIDER_SITE_OTHER): Admitting: Physician Assistant

## 2023-09-05 ENCOUNTER — Encounter: Payer: Self-pay | Admitting: Physician Assistant

## 2023-09-05 VITALS — BP 118/88 | HR 66 | Resp 16 | Wt 172.2 lb

## 2023-09-05 DIAGNOSIS — M79605 Pain in left leg: Secondary | ICD-10-CM

## 2023-09-05 DIAGNOSIS — I10 Essential (primary) hypertension: Secondary | ICD-10-CM | POA: Diagnosis not present

## 2023-09-05 DIAGNOSIS — Z720 Tobacco use: Secondary | ICD-10-CM

## 2023-09-05 DIAGNOSIS — R739 Hyperglycemia, unspecified: Secondary | ICD-10-CM | POA: Diagnosis not present

## 2023-09-05 MED ORDER — CELECOXIB 200 MG PO CAPS
200.0000 mg | ORAL_CAPSULE | Freq: Two times a day (BID) | ORAL | 1 refills | Status: AC
Start: 2023-09-05 — End: ?

## 2023-09-05 NOTE — Progress Notes (Signed)
 Established patient visit  Patient: Richard Lamb   DOB: 1950/08/07   73 y.o. Male  MRN: [id number] Visit Date: 09/05/2023  Today's healthcare provider: Blane Bunting, PA-C   Chief Complaint  Patient presents with   Leg Pain    Leg pain around the hip area.. for about a month. Getting over time   Subjective      Discussed the use of AI scribe software for clinical note transcription with the patient, who gave verbal consent to proceed.  History of Present Illness The patient presents with intermittent leg pain that has been occurring daily for an unspecified duration. The pain is described as starting in the hip and shooting down the leg. It is most noticeable in the morning, lasting for about ten minutes before subsiding, and during physical activities such as mowing the lawn or performing manual labor at work. The patient denies any specific injury or overuse that may have initiated the pain. He also denies any previous issues with the hip or back. The patient has been managing the pain with ibuprofen, which he reports as effective. He also reports occasional shoulder pain at night. The patient has a history of hypertension and hyperlipidemia, for which he is on medication. He smokes approximately two cigarettes per day but expresses willingness to quit.      09/05/2023   10:05 AM 06/24/2023    2:33 PM 11/05/2022    9:06 AM  Depression screen PHQ 2/9  Decreased Interest 0 0 0  Down, Depressed, Hopeless 0 1 0  PHQ - 2 Score 0 1 0  Altered sleeping 0 0 0  Tired, decreased energy 0 0 0  Change in appetite 0 0 0  Feeling bad or failure about yourself  0 0 0  Trouble concentrating 0 0 0  Moving slowly or fidgety/restless 0 0 0  Suicidal thoughts 0 0 0  PHQ-9 Score 0 1 0  Difficult doing work/chores Not difficult at all Not difficult at all Not difficult at all      09/05/2023   10:06 AM  GAD 7 : Generalized Anxiety Score  Nervous, Anxious, on Edge 0  Control/stop  worrying 0  Worry too much - different things 0  Trouble relaxing 0  Restless 0  Afraid - awful might happen 0  Anxiety Difficulty Not difficult at all    Medications: Outpatient Medications Prior to Visit  Medication Sig   amLODipine (NORVASC) 10 MG tablet TAKE 1 TABLET BY MOUTH EVERY DAY   atorvastatin (LIPITOR) 10 MG tablet TAKE 1 TABLET BY MOUTH EVERY DAY   lisinopril (ZESTRIL) 40 MG tablet TAKE 1 TABLET BY MOUTH EVERY DAY   metoprolol succinate (TOPROL-XL) 25 MG 24 hr tablet TAKE 1 TABLET (25 MG TOTAL) BY MOUTH DAILY.   sildenafil (VIAGRA) 100 MG tablet Take 0.5-1 tablets (50-100 mg total) by mouth daily as needed for erectile dysfunction.   No facility-administered medications prior to visit.    Review of Systems All negative Except see HPI       Objective    BP 118/88 (BP Location: Right Arm, Patient Position: Sitting, Cuff Size: Normal)   Pulse 66   Resp 16   Wt 172 lb 3.2 oz (78.1 kg)   SpO2 99%   BMI 23.35 kg/m     Physical Exam Vitals reviewed.  Constitutional:      General: He is not in acute distress.    Appearance: Normal appearance. He is not diaphoretic.  HENT:  Head: Normocephalic and atraumatic.  Eyes:     General: No scleral icterus.    Conjunctiva/sclera: Conjunctivae normal.  Cardiovascular:     Rate and Rhythm: Normal rate and regular rhythm.     Pulses: Normal pulses.     Heart sounds: Normal heart sounds. No murmur heard. Pulmonary:     Effort: Pulmonary effort is normal. No respiratory distress.     Breath sounds: Normal breath sounds. No wheezing or rhonchi.  Musculoskeletal:     Cervical back: Neck supple.     Right lower leg: No edema.     Left lower leg: No edema.  Lymphadenopathy:     Cervical: No cervical adenopathy.  Skin:    General: Skin is warm and dry.     Findings: No rash.  Neurological:     Mental Status: He is alert and oriented to person, place, and time. Mental status is at baseline.  Psychiatric:         Mood and Affect: Mood normal.        Behavior: Behavior normal.      No results found for any visits on 09/05/23.      Assessment & Plan Leg Pain  Intermittent leg pain during activity, likely musculoskeletal strain or sciatica. No trauma or back issues reported. - Refer to physical therapy for evaluation and management. - Advise heat and ice application as needed. - Recommend Voltaren gel for pain relief. - Continue ibuprofen as needed. - Order blood work to rule out other causes. - Consider x-ray if symptoms persist or worsen.  Hypertension chronic Well-controlled with medication. - Continue current antihypertensive medication. amlodipine 10mg , lisinopril 40mg , metoprolol 25mg . Will follow-up  Hyperlipidemia Chronic On medication with no issues: atorvastatin 10 - Continue current cholesterol medication.  Tobacco Use Smokes two cigarettes per day, open to quitting. Discussed cessation resources. - Provide information on Quit Smoking resources, including patches, gums, Chantix, and counseling.  General Health Maintenance Due for colon cancer screening, open to Cologuard. - Provide Cologuard kit for colon cancer screening.   Orders Placed This Encounter  Procedures   Ambulatory referral to Physical Therapy    Referral Priority:   Routine    Referral Type:   Physical Medicine    Referral Reason:   Specialty Services Required    Requested Specialty:   Physical Therapy    Number of Visits Requested:   1    No follow-ups on file.   The patient was advised to call back or seek an in-person evaluation if the symptoms worsen or if the condition fails to improve as anticipated.  I discussed the assessment and treatment plan with the patient. The patient was provided an opportunity to ask questions and all were answered. The patient agreed with the plan and demonstrated an understanding of the instructions.  I, Donnalee Cellucci, PA-C have reviewed all documentation for this  visit. The documentation on 09/05/2023  for the exam, diagnosis, procedures, and orders are all accurate and complete.  Blane Bunting, [facility], MMS [facility] [phone] (phone) [phone] (fax)  [facility]

## 2023-09-15 ENCOUNTER — Emergency Department

## 2023-09-15 ENCOUNTER — Emergency Department
Admission: EM | Admit: 2023-09-15 | Discharge: 2023-09-15 | Disposition: A | Discharge status: Home / Self Care | Attending: Emergency Medicine | Admitting: Emergency Medicine

## 2023-09-15 ENCOUNTER — Other Ambulatory Visit: Payer: Self-pay

## 2023-09-15 DIAGNOSIS — Z716 Tobacco abuse counseling: Secondary | ICD-10-CM | POA: Diagnosis not present

## 2023-09-15 DIAGNOSIS — R072 Precordial pain: Secondary | ICD-10-CM | POA: Diagnosis not present

## 2023-09-15 DIAGNOSIS — R0789 Other chest pain: Secondary | ICD-10-CM | POA: Diagnosis not present

## 2023-09-15 DIAGNOSIS — R079 Chest pain, unspecified: Secondary | ICD-10-CM

## 2023-09-15 DIAGNOSIS — F17200 Nicotine dependence, unspecified, uncomplicated: Secondary | ICD-10-CM | POA: Insufficient documentation

## 2023-09-15 DIAGNOSIS — I10 Essential (primary) hypertension: Secondary | ICD-10-CM | POA: Insufficient documentation

## 2023-09-15 LAB — CBC WITH DIFFERENTIAL/PLATELET
Abs Immature Granulocytes: 0.02 10*3/uL (ref 0.00–0.07)
Basophils Absolute: 0 10*3/uL (ref 0.0–0.1)
Basophils Relative: 0 %
Eosinophils Absolute: 0.1 10*3/uL (ref 0.0–0.5)
Eosinophils Relative: 1 %
HCT: 46.9 % (ref 39.0–52.0)
Hemoglobin: 15.7 g/dL (ref 13.0–17.0)
Immature Granulocytes: 0 %
Lymphocytes Relative: 34 %
Lymphs Abs: 2.7 10*3/uL (ref 0.7–4.0)
MCH: 31.1 pg (ref 26.0–34.0)
MCHC: 33.5 g/dL (ref 30.0–36.0)
MCV: 92.9 fL (ref 80.0–100.0)
Monocytes Absolute: 1 10*3/uL (ref 0.1–1.0)
Monocytes Relative: 13 %
Neutro Abs: 4 10*3/uL (ref 1.7–7.7)
Neutrophils Relative %: 52 %
Platelets: 210 10*3/uL (ref 150–400)
RBC: 5.05 MIL/uL (ref 4.22–5.81)
RDW: 12.1 % (ref 11.5–15.5)
WBC: 7.8 10*3/uL (ref 4.0–10.5)
nRBC: 0 % (ref 0.0–0.2)

## 2023-09-15 LAB — COMPREHENSIVE METABOLIC PANEL WITH GFR
ALT: 20 U/L (ref 0–44)
AST: 19 U/L (ref 15–41)
Albumin: 3.3 g/dL — ABNORMAL LOW (ref 3.5–5.0)
Alkaline Phosphatase: 37 U/L — ABNORMAL LOW (ref 38–126)
Anion gap: 7 (ref 5–15)
BUN: 23 mg/dL (ref 8–23)
CO2: 29 mmol/L (ref 22–32)
Calcium: 9.2 mg/dL (ref 8.9–10.3)
Chloride: 97 mmol/L — ABNORMAL LOW (ref 98–111)
Creatinine, Ser: 1.25 mg/dL — ABNORMAL HIGH (ref 0.61–1.24)
GFR, Estimated: >60 mL/min (ref >60)
Glucose, Bld: 115 mg/dL — ABNORMAL HIGH (ref 70–99)
Potassium: 4.1 mmol/L (ref 3.5–5.1)
Sodium: 133 mmol/L — ABNORMAL LOW (ref 135–145)
Total Bilirubin: 0.5 mg/dL (ref 0.0–1.2)
Total Protein: 6.3 g/dL — ABNORMAL LOW (ref 6.5–8.1)

## 2023-09-15 LAB — LIPASE, BLOOD: Lipase: 35 U/L (ref 11–51)

## 2023-09-15 LAB — D-DIMER, QUANTITATIVE: D-Dimer, Quant: 0.38 ug{FEU}/mL (ref 0.00–0.50)

## 2023-09-15 LAB — TROPONIN I (HIGH SENSITIVITY): Troponin I (High Sensitivity): 9 ng/L (ref <18)

## 2023-09-15 MED ORDER — NICOTINE 7 MG/24HR TD PT24: 7.0000 mg | MEDICATED_PATCH | Freq: Every day | TRANSDERMAL | 0 refills | Status: DC

## 2023-09-15 MED ORDER — OMEPRAZOLE MAGNESIUM 20 MG PO TBEC: 20.0000 mg | DELAYED_RELEASE_TABLET | Freq: Every day | ORAL | 1 refills | Status: DC

## 2023-09-15 MED ORDER — ASPIRIN 81 MG PO CHEW
324.0000 mg | CHEWABLE_TABLET | Freq: Once | ORAL | Status: AC
  Administered 2023-09-15: 324 mg via ORAL
  Filled 2023-09-15: qty 4

## 2023-09-15 MED ORDER — LIDOCAINE VISCOUS HCL 2 % MT SOLN
15.0000 mL | Freq: Once | OROMUCOSAL | Status: AC
  Administered 2023-09-15: 15 mL via ORAL
  Filled 2023-09-15: qty 15

## 2023-09-15 MED ORDER — NICOTINE POLACRILEX 4 MG MT LOZG: 4.0000 mg | LOZENGE | OROMUCOSAL | 0 refills | Status: DC | PRN

## 2023-09-15 MED ORDER — MELATONIN 5 MG PO TABS
2.5000 mg | ORAL_TABLET | Freq: Every day | ORAL | Status: DC
Start: 2023-09-15 — End: 2023-09-15

## 2023-09-15 MED ORDER — MELATONIN 3 MG PO TABS: 3.0000 mg | ORAL_TABLET | Freq: Every day | ORAL | 3 refills | Status: DC

## 2023-09-15 MED ORDER — ALUM & MAG HYDROXIDE-SIMETH 200-200-20 MG/5ML PO SUSP
30.0000 mL | Freq: Once | ORAL | Status: AC
  Administered 2023-09-15: 30 mL via ORAL
  Filled 2023-09-15: qty 30

## 2023-09-15 NOTE — ED Provider Notes (Addendum)
 [facility] Provider Note    Event Date/Time   First MD Initiated Contact with Patient 09/15/23 [phone]     (approximate)   History   Chest Pain   HPI  Richard Lamb is a 73 y.o. male   Past medical history of hypertension hyperlipidemia, smoker, who presents to Emergency Department with intermittent chest pain bothering for last 2 weeks.  Nonexertional nonradiating substernal chest pain.  It started coincidentally after he was suffering from a GI illness including nausea vomiting diarrhea abdominal pain 2 weeks ago which completely resolved but this epigastric/substernal chest pain continues.  He feels that it is relieved after he belches.  When the pain comes, it is associated with some shortness of breath.  He has no chest pain or shortness of breath currently and feels comfortable.  He reports no history of blood clot but does note that his left hip has been bothering him, and saw his regular doctor last week which was thought to be arthritis of the hip.  External Medical Documents Reviewed: Office visit with family medicine on April 11 with left leg pain.      Physical Exam   Triage Vital Signs: ED Triage Vitals  Encounter Vitals Group     BP 09/15/23 0132 121/76     Systolic BP Percentile --      Diastolic BP Percentile --      Pulse Rate 09/15/23 0132 97     Resp 09/15/23 0123 20     Temp 09/15/23 0132 98.9 F (37.2 C)     Temp Source 09/15/23 0132 Oral     SpO2 09/15/23 0132 100 %     Weight 09/15/23 0124 180 lb (81.6 kg)     Height 09/15/23 0124 6' (1.829 m)     Head Circumference --      Peak Flow --      Pain Score 09/15/23 0124 0     Pain Loc --      Pain Education --      Exclude from Growth Chart --     Most recent vital signs: Vitals:   09/15/23 0123 09/15/23 0132  BP:  121/76  Pulse:  97  Resp: 20   Temp:  98.9 F (37.2 C)  SpO2:  100%    General: Awake, no distress.  CV:  Good peripheral perfusion.   Resp:  Normal effort.  Abd:  No distention. Other:  Resting comfortably breathing comfortably on room air with normal vital signs.  Heart sounds normal lung sounds normal, soft benign abdominal exam   ED Results / Procedures / Treatments   Labs (all labs ordered are listed, but only abnormal results are displayed) Labs Reviewed  COMPREHENSIVE METABOLIC PANEL WITH GFR - Abnormal; Notable for the following components:      Result Value   Sodium 133 (*)    Chloride 97 (*)    Glucose, Bld 115 (*)    Creatinine, Ser 1.25 (*)    Total Protein 6.3 (*)    Albumin 3.3 (*)    Alkaline Phosphatase 37 (*)    All other components within normal limits  CBC WITH DIFFERENTIAL/PLATELET  D-DIMER, QUANTITATIVE  LIPASE, BLOOD  TROPONIN I (HIGH SENSITIVITY)     I ordered and reviewed the above labs they are notable for initial troponin is 9  EKG  ED ECG REPORT I, Buell Carmin, the attending physician, personally viewed and interpreted this ECG.   Date: 09/15/2023  EKG Time: 0128  Rate: 99  Rhythm: sinus  Axis: nl  Intervals:nl  ST&T Change: no stemi, +PVC    RADIOLOGY I independently reviewed and interpreted chest x-ray and I see no obvious focality pneumothorax I also reviewed radiologist's formal read.   PROCEDURES:  Critical Care performed: No  Procedures   MEDICATIONS ORDERED IN ED: Medications  melatonin tablet 3 mg (has no administration in time range)  alum & mag hydroxide-simeth (MAALOX/MYLANTA) 200-200-20 MG/5ML suspension 30 mL (30 mLs Oral Given 09/15/23 0213)    And  lidocaine  (XYLOCAINE ) 2 % viscous mouth solution 15 mL (15 mLs Oral Given 09/15/23 0981)  aspirin  chewable tablet 324 mg (324 mg Oral Given 09/15/23 0213)     IMPRESSION / MDM / ASSESSMENT AND PLAN / ED COURSE  I reviewed the triage vital signs and the nursing notes.                                Patient's presentation is most consistent with acute presentation with potential threat to life or  bodily function.  Differential diagnosis includes, but is not limited to, ACS, PE, dissection, gastritis/GERD/ulcer, gas pain, costochondritis, considered but less likely respiratory infection   The patient is on the cardiac monitor to evaluate for evidence of arrhythmia and/or significant heart rate changes.  MDM:    This is very nonspecific chest pain intermittent substernal chest pain with associated shortness of breath which is nonexertional and nonradiating and he currently has no chest pain or symptoms at all.  He does have some cardiac risk factors and hypertension hyperlipidemia and smoker.  So I think it is important to check for ACS with EKG which fortunately was nonischemic, and initial troponin is normal.  Follow-up with serial troponin.  I considered dissection but this is unlikely given the intermittent nature of her symptoms.  I considered PE given the associated shortness of breath, and this left leg pain that he had been recently evaluated for.  I have low clinical suspicion we will start with D-dimer.  Could be related to gastritis/GERD in the setting of his recent GI illness, as well as his sensations of gas pain relieved with belching.  Will give GI cocktail.  I considered hospitalization for admission or observation but ultimately patient looks stable and is asymptomatic currently so if workup above with dimer/Trop/chest x-ray are unremarkable and he remains asymptomatic and vital signs normal I think he can be further evaluated as an outpatient with his PMD and a cardiology referral.  I will write him a PPI.  -- Dimer negative.  Trope negative.  Patient asymptomatic and eager to leave the emergency department and does not want to stay for second troponin think this is reasonable given the chronicity of symptoms 2 weeks doubt ACS given normal troponin tonight.  He feels much better after the GI cocktail so I prescribed a PPI.  He wants a melatonin for sleep so I prescribed  this as well.  Discharge  --  I spent 5 minutes counseling this patient on smoking cessation.  We spoke about the patient's current tobacco use, impact of smoking, assessed willingness to quit, methods for cessation including medical management and nicotine  replacement therapy (which I prescribed to the patient) and advised follow-up with primary doctor to continue to address smoking cessation.       FINAL CLINICAL IMPRESSION(S) / ED DIAGNOSES   Final diagnoses:  Nonspecific chest pain  Encounter for smoking cessation counseling  Rx / DC Orders   ED Discharge Orders          Ordered    omeprazole  (PRILOSEC  OTC) 20 MG tablet  Daily        09/15/23 0208    nicotine  polacrilex (NICOTINE  MINI) 4 MG lozenge  As needed        09/15/23 0208    nicotine  (NICODERM CQ  - DOSED IN MG/24 HR) 7 mg/24hr patch  Daily        09/15/23 0208    Ambulatory referral to Cardiology       Comments: If you have not heard from the Cardiology office within the next 72 hours please call [phone].   09/15/23 0208    melatonin 3 MG TABS tablet  Daily at bedtime        09/15/23 6578             Note:  This document was prepared using Dragon voice recognition software and may include unintentional dictation errors.    Buell Carmin, MD 09/15/23 4696    Buell Carmin, MD 09/15/23 [phone]

## 2023-09-15 NOTE — ED Notes (Signed)
 Pt Aox4 cleared and discharged. All discharge teachings given to pt. Pt verbalizes back understanding of teachings. Departed with steady gait

## 2023-09-15 NOTE — Discharge Instructions (Addendum)
 Fortunately your testing in the emerged apartment did not show any emergency conditions that accounted for your symptoms.  Take the acid reducing medication called Prilosec  daily and follow-up with your primary doctor.  Drink less alcohol if possible.  Take melatonin before bedtime to help with sleep.   Thank you for choosing us  for your health care today!  Please see your primary doctor this week for a follow up appointment.   If you have any new, worsening, or unexpected symptoms call your doctor right away or come back to the emergency department for reevaluation.  It was my pleasure to care for you today.   Arron Large Margery Sheets, MD

## 2023-09-15 NOTE — ED Triage Notes (Signed)
 Pt arrives via POV with CC of intermittent central CP for last few days with associated SOB. A&Ox4. NAD at this time.

## 2023-09-22 ENCOUNTER — Ambulatory Visit: Payer: Self-pay

## 2023-09-22 NOTE — Telephone Encounter (Signed)
  Chief Complaint: Hiccups  Symptoms: Restlessness  Frequency: 5 Days  Pertinent Negatives: Patient denies chest pain, dyspnea, dizziness,   Disposition: [] ED /[] Urgent Care (no appt availability in office) / [x] Appointment(In office/virtual)/ []  [facility] [place] [facility]/ [] Home Care/ [] Refused Recommended Disposition /[] [facility]/ []  Follow-up with PCP Additional Notes: OS is being triaged for hiccups for 5 days and restlessness. The patient had a stomach virus recently, and was told by an ER Physician he had a lot of gas building up in his abdomen. The patient reports the hiccups are preventing him from sleeping. In office appointment made for tomorrow.   Reason for Disposition  [1] Hiccups present > 24 hours AND [2] nearly continuous  Answer Assessment - Initial Assessment Questions 1. ONSET: "When did the hiccups begin?"      5 Days  2. SEVERITY: "How bad are the hiccups now?"  (Severe: unable to eat, drink or sleep because of hiccups)     Prevents eating and drinking when occuring  3. TREATMENT: "What have you done so far to treat the hiccups?"     None  4. RECURRENT SYMPTOM: "Have you ever had severe hiccups before?" If Yes, ask: "When was the last time?" and "What happened that time?"      No  5. OTHER SYMPTOMS: "Do you have any other symptoms? (e.g., chest pain, difficulty breathing,  abdomen pain, vomiting)     Abdominal Pain (Lower)  Protocols used: Hiccups-A-AH

## 2023-09-23 ENCOUNTER — Encounter: Payer: Self-pay | Admitting: Physician Assistant

## 2023-09-23 ENCOUNTER — Ambulatory Visit
Admission: RE | Admit: 2023-09-23 | Discharge: 2023-09-23 | Disposition: A | Discharge status: Home / Self Care | Attending: Physician Assistant | Admitting: Physician Assistant

## 2023-09-23 ENCOUNTER — Ambulatory Visit
Admission: RE | Admit: 2023-09-23 | Discharge: 2023-09-23 | Disposition: A | Discharge status: Home / Self Care | Source: Ambulatory Visit | Attending: Physician Assistant

## 2023-09-23 ENCOUNTER — Ambulatory Visit (INDEPENDENT_AMBULATORY_CARE_PROVIDER_SITE_OTHER): Admitting: Physician Assistant

## 2023-09-23 VITALS — BP 96/68 | HR 76 | Resp 16 | Ht 72.0 in | Wt 160.0 lb

## 2023-09-23 DIAGNOSIS — R059 Cough, unspecified: Secondary | ICD-10-CM | POA: Diagnosis not present

## 2023-09-23 DIAGNOSIS — R066 Hiccough: Secondary | ICD-10-CM | POA: Diagnosis not present

## 2023-09-23 DIAGNOSIS — K219 Gastro-esophageal reflux disease without esophagitis: Secondary | ICD-10-CM

## 2023-09-23 DIAGNOSIS — R051 Acute cough: Secondary | ICD-10-CM | POA: Insufficient documentation

## 2023-09-23 MED ORDER — ALBUTEROL SULFATE HFA 108 (90 BASE) MCG/ACT IN AERS: 2.0000 | INHALATION_SPRAY | Freq: Four times a day (QID) | RESPIRATORY_TRACT | 2 refills | Status: AC | PRN

## 2023-09-23 MED ORDER — OMEPRAZOLE 20 MG PO CPDR: 20.0000 mg | DELAYED_RELEASE_CAPSULE | Freq: Two times a day (BID) | ORAL | 3 refills | Status: DC

## 2023-09-23 NOTE — Progress Notes (Unsigned)
 [ Established patient visit  Patient: Richard Lamb   DOB: 12-25-1950   73 y.o. Male  MRN: [id number] Visit Date: 09/23/2023  Today's healthcare provider: Blane Bunting, PA-C   Chief Complaint  Patient presents with   Hiccups    Hiccups is causing the pt to not be able to sleep. Pt states it worst at night.   Subjective     HPI     Hiccups    Additional comments: Hiccups is causing the pt to not be able to sleep. Pt states it worst at night.      Last edited by Estill Hemming, CMA on 09/23/2023 10:57 AM.       Discussed the use of AI scribe software for clinical note transcription with the patient, who gave verbal consent to proceed.  History of Present Illness        09/05/2023   10:05 AM 06/24/2023    2:33 PM 11/05/2022    9:06 AM  Depression screen PHQ 2/9  Decreased Interest 0 0 0  Down, Depressed, Hopeless 0 1 0  PHQ - 2 Score 0 1 0  Altered sleeping 0 0 0  Tired, decreased energy 0 0 0  Change in appetite 0 0 0  Feeling bad or failure about yourself  0 0 0  Trouble concentrating 0 0 0  Moving slowly or fidgety/restless 0 0 0  Suicidal thoughts 0 0 0  PHQ-9 Score 0 1 0  Difficult doing work/chores Not difficult at all Not difficult at all Not difficult at all      09/05/2023   10:06 AM  GAD 7 : Generalized Anxiety Score  Nervous, Anxious, on Edge 0  Control/stop worrying 0  Worry too much - different things 0  Trouble relaxing 0  Restless 0  Afraid - awful might happen 0  Anxiety Difficulty Not difficult at all    Medications: Outpatient Medications Prior to Visit  Medication Sig   amLODipine  (NORVASC ) 10 MG tablet TAKE 1 TABLET BY MOUTH EVERY DAY   atorvastatin  (LIPITOR) 10 MG tablet TAKE 1 TABLET BY MOUTH EVERY DAY   celecoxib  (CELEBREX ) 200 MG capsule Take 1 capsule (200 mg total) by mouth 2 (two) times daily.   lisinopril  (ZESTRIL ) 40 MG tablet TAKE 1 TABLET BY MOUTH EVERY DAY   melatonin 3 MG TABS tablet Take 1 tablet (3 mg total)  by mouth at bedtime.   metoprolol  succinate (TOPROL -XL) 25 MG 24 hr tablet TAKE 1 TABLET (25 MG TOTAL) BY MOUTH DAILY.   nicotine  (NICODERM CQ  - DOSED IN MG/24 HR) 7 mg/24hr patch Place 1 patch (7 mg total) onto the skin daily.   nicotine  polacrilex (NICOTINE  MINI) 4 MG lozenge Take 1 lozenge (4 mg total) by mouth as needed.   omeprazole  (PRILOSEC  OTC) 20 MG tablet Take 1 tablet (20 mg total) by mouth daily.   sildenafil  (VIAGRA ) 100 MG tablet Take 0.5-1 tablets (50-100 mg total) by mouth daily as needed for erectile dysfunction.   No facility-administered medications prior to visit.    Review of Systems All negative Except see HPI   {Insert previous labs (optional):23779} {See past labs  Heme  Chem  Endocrine  Serology  Results Review (optional):1}   Objective    BP 96/68 (BP Location: Right Arm, Patient Position: Sitting, Cuff Size: Normal)   Pulse 76   Resp 16   Ht 6' (1.829 m)   Wt 160 lb (72.6 kg)   SpO2 95%   BMI 21.70  kg/m  {Insert last BP/Wt (optional):23777}{See vitals history (optional):1}   Physical Exam   No results found for any visits on 09/23/23.      Assessment and Plan Assessment & Plan     No orders of the defined types were placed in this encounter.   No follow-ups on file.   The patient was advised to call back or seek an in-person evaluation if the symptoms worsen or if the condition fails to improve as anticipated.  I discussed the assessment and treatment plan with the patient. The patient was provided an opportunity to ask questions and all were answered. The patient agreed with the plan and demonstrated an understanding of the instructions.  I, Nicloe Frontera, PA-C have reviewed all documentation for this visit. The documentation on 09/23/2023  for the exam, diagnosis, procedures, and orders are all accurate and complete.  Blane Bunting, [facility], MMS [facility] [phone] (phone) [phone] (fax)  [facility]

## 2023-09-24 DIAGNOSIS — K219 Gastro-esophageal reflux disease without esophagitis: Secondary | ICD-10-CM | POA: Insufficient documentation

## 2023-09-24 DIAGNOSIS — R066 Hiccough: Secondary | ICD-10-CM | POA: Insufficient documentation

## 2023-09-25 ENCOUNTER — Other Ambulatory Visit: Payer: Self-pay | Admitting: Physician Assistant

## 2023-09-25 DIAGNOSIS — A048 Other specified bacterial intestinal infections: Secondary | ICD-10-CM

## 2023-09-25 LAB — H. PYLORI BREATH TEST

## 2023-09-25 LAB — CBC WITH DIFFERENTIAL/PLATELET
Basophils Absolute: 0 10*3/uL (ref 0.0–0.2)
Basos: 0 %
EOS (ABSOLUTE): 0 10*3/uL (ref 0.0–0.4)
Eos: 0 %
Hematocrit: 47.3 % (ref 37.5–51.0)
Hemoglobin: 16.4 g/dL (ref 13.0–17.7)
Immature Grans (Abs): 0 10*3/uL (ref 0.0–0.1)
Immature Granulocytes: 0 %
Lymphocytes Absolute: 1.3 10*3/uL (ref 0.7–3.1)
Lymphs: 16 %
MCH: 30.9 pg (ref 26.6–33.0)
MCHC: 34.7 g/dL (ref 31.5–35.7)
MCV: 89 fL (ref 79–97)
Monocytes Absolute: 1 10*3/uL — ABNORMAL HIGH (ref 0.1–0.9)
Monocytes: 11 %
Neutrophils Absolute: 6.3 10*3/uL (ref 1.4–7.0)
Neutrophils: 73 %
Platelets: 267 10*3/uL (ref 150–450)
RBC: 5.3 x10E6/uL (ref 4.14–5.80)
RDW: 11.4 % — ABNORMAL LOW (ref 11.6–15.4)
WBC: 8.7 10*3/uL (ref 3.4–10.8)

## 2023-09-25 LAB — COMPREHENSIVE METABOLIC PANEL WITH GFR
ALT: 27 IU/L (ref 0–44)
AST: 20 IU/L (ref 0–40)
Albumin: 3.5 g/dL — ABNORMAL LOW (ref 3.9–4.9)
Alkaline Phosphatase: 64 IU/L (ref 44–121)
BUN/Creatinine Ratio: 19 (ref 10–24)
BUN: 22 mg/dL (ref 8–27)
Bilirubin Total: 0.5 mg/dL (ref 0.0–1.2)
CO2: 25 mmol/L (ref 20–29)
Calcium: 9.8 mg/dL (ref 8.6–10.2)
Chloride: 91 mmol/L — ABNORMAL LOW (ref 96–106)
Creatinine, Ser: 1.15 mg/dL (ref 0.76–1.27)
Globulin, Total: 2.1 g/dL (ref 1.5–4.5)
Glucose: 107 mg/dL — ABNORMAL HIGH (ref 70–99)
Potassium: 4.8 mmol/L (ref 3.5–5.2)
Sodium: 131 mmol/L — ABNORMAL LOW (ref 134–144)
Total Protein: 5.6 g/dL — ABNORMAL LOW (ref 6.0–8.5)
eGFR: 68 mL/min/{1.73_m2} (ref >59)

## 2023-09-25 MED ORDER — AMOXICILLIN 500 MG PO CAPS: 1000.0000 mg | ORAL_CAPSULE | Freq: Two times a day (BID) | ORAL | 0 refills | Status: DC

## 2023-09-25 MED ORDER — CLARITHROMYCIN 500 MG PO TABS: 500.0000 mg | ORAL_TABLET | Freq: Two times a day (BID) | ORAL | 0 refills | Status: DC

## 2023-09-25 NOTE — Progress Notes (Signed)
 Normal chest xr

## 2023-09-25 NOTE — Progress Notes (Signed)
 You have H pylori infection. 2 meds were sent to your pharmacy, take antibiotics with /after a meal and yogurt/probiotics, continue taking omeprazole  twice daily for 14 days. Will retest in 2 months, you will need to stop taking omeprazole  1-2 weeks prior the test.

## 2023-09-26 ENCOUNTER — Telehealth: Payer: Self-pay

## 2023-09-26 NOTE — Telephone Encounter (Signed)
 Aaron Aas

## 2023-10-08 ENCOUNTER — Ambulatory Visit: Admitting: Cardiology

## 2023-10-30 ENCOUNTER — Other Ambulatory Visit: Payer: Self-pay | Admitting: Family Medicine

## 2023-10-30 DIAGNOSIS — I10 Essential (primary) hypertension: Secondary | ICD-10-CM

## 2023-10-31 NOTE — Telephone Encounter (Signed)
 Pt need an office visit.

## 2023-10-31 NOTE — Telephone Encounter (Signed)
 Called and scheduled pt for yearly physical on 12/09/2023 at 1:40 with Janna Ostwalt, PA-C.   90 day supply given with no refills.    Requested Prescriptions  Pending Prescriptions Disp Refills   metoprolol  succinate (TOPROL -XL) 25 MG 24 hr tablet [Pharmacy Med Name: METOPROLOL  SUCC ER 25 MG TAB] 90 tablet 0    Sig: TAKE 1 TABLET (25 MG TOTAL) BY MOUTH DAILY.     Cardiovascular:  Beta Blockers Failed - 10/31/2023 10:21 AM      Failed - Valid encounter within last 6 months    Recent Outpatient Visits           1 month ago Gastroesophageal reflux disease without esophagitis   [place] [facility] [place], [place], PA-C   1 month ago Left leg pain   [place] [facility] [place], [place], PA-C       Future Appointments             In 6 days Constancia Delton, MD [facility] - Last BP in normal range    BP Readings from Last 1 Encounters:  09/23/23 96/68         Passed - Last Heart Rate in normal range    Pulse Readings from Last 1 Encounters:  09/23/23 76

## 2023-11-06 ENCOUNTER — Ambulatory Visit: Attending: Cardiology | Admitting: Cardiology

## 2023-11-06 VITALS — BP 148/62 | HR 65 | Ht 72.0 in | Wt 165.6 lb

## 2023-11-06 DIAGNOSIS — F17200 Nicotine dependence, unspecified, uncomplicated: Secondary | ICD-10-CM

## 2023-11-06 DIAGNOSIS — E7800 Pure hypercholesterolemia, unspecified: Secondary | ICD-10-CM

## 2023-11-06 DIAGNOSIS — R072 Precordial pain: Secondary | ICD-10-CM | POA: Diagnosis not present

## 2023-11-06 DIAGNOSIS — I10 Essential (primary) hypertension: Secondary | ICD-10-CM

## 2023-11-06 MED ORDER — ASPIRIN 81 MG PO TBEC: 81.0000 mg | DELAYED_RELEASE_TABLET | Freq: Every day | ORAL | Status: AC

## 2023-11-06 NOTE — Progress Notes (Signed)
 Cardiology Office Note:    Date:  11/06/2023   ID:  Richard Lamb, DOB 16-Sep-1950, MRN [id number]  PCP:  Blane Bunting, PA-C   [facility] Providers Cardiologist:  None     Referring MD: Buell Carmin, MD   Chief Complaint  Patient presents with   Chest Pain   Richard Lamb is a 73 y.o. male who is being seen today for the evaluation of chest pain at the request of Buell Carmin, MD.   History of Present Illness:    Richard Lamb is a 73 y.o. male with a hx of hypertension, hyperlipidemia, current smoker x 20+ years presenting with chest pain.  Had symptoms of chest pain about 6 weeks ago while at home.  States having abdominal pain, nausea and feeling bloated/gassy.  Presented to the ED where workup with troponins, EKG was unrevealing.  Was given GI cocktail which helped.  Followed up with PCP, H. pylori breath test was positive.  Treated with PPI, antibiotics with resolution of symptoms.  Denies any further episodes of chest discomfort or abdominal pain.  Pretty active, works at a golf course.  Past Medical History:  Diagnosis Date   Anxiety    GERD (gastroesophageal reflux disease)    Hyperlipidemia    Hypertension     Past Surgical History:  Procedure Laterality Date   CYSTOSCOPY WITH LITHOLAPAXY N/A 06/30/2018   Procedure: CYSTOSCOPY WITH LITHOLAPAXY;  Surgeon: Geraline Knapp, MD;  Location: [facility] ORS;  Service: Urology;  Laterality: N/A;   PROSTATE SURGERY     Dr.Wolfe    Current Medications: Current Meds  Medication Sig   albuterol  (VENTOLIN  HFA) 108 (90 Base) MCG/ACT inhaler Inhale 2 puffs into the lungs every 6 (six) hours as needed for wheezing or shortness of breath.   amLODipine  (NORVASC ) 10 MG tablet TAKE 1 TABLET BY MOUTH EVERY DAY   aspirin  EC 81 MG tablet Take 1 tablet (81 mg total) by mouth daily. Swallow whole.   atorvastatin  (LIPITOR) 10 MG tablet TAKE 1 TABLET BY MOUTH EVERY DAY   lisinopril  (ZESTRIL ) 40 MG tablet TAKE 1  TABLET BY MOUTH EVERY DAY   metoprolol  succinate (TOPROL -XL) 25 MG 24 hr tablet TAKE 1 TABLET (25 MG TOTAL) BY MOUTH DAILY.   sildenafil  (VIAGRA ) 100 MG tablet Take 0.5-1 tablets (50-100 mg total) by mouth daily as needed for erectile dysfunction.     Allergies:   Sulfa antibiotics   Social History   Socioeconomic History   Marital status: Single    Spouse name: Not on file   Number of children: 3   Years of education: Not on file   Highest education level: High school graduate  Occupational History    Comment: full time  Tobacco Use   Smoking status: Every Day    Current packs/day: 0.50    Types: Cigarettes   Smokeless tobacco: Never   Tobacco comments:    < than half pack a day.   Vaping Use   Vaping status: Never Used  Substance and Sexual Activity   Alcohol use: Yes    Alcohol/week: 7.0 standard drinks of alcohol    Types: 7 Cans of beer per week    Comment: 1 beer a day   Drug use: Yes    Types: Marijuana   Sexual activity: Yes    Birth control/protection: None  Other Topics Concern   Not on file  Social History Narrative   Not on file   Social Drivers of Health  Financial Resource Strain: Low Risk  (06/24/2023)   Overall Financial Resource Strain (CARDIA)    Difficulty of Paying Living Expenses: Not very hard  Food Insecurity: No Food Insecurity (06/24/2023)   Hunger Vital Sign    Worried About Running Out of Food in the Last Year: Never true    Ran Out of Food in the Last Year: Never true  Transportation Needs: No Transportation Needs (06/24/2023)   PRAPARE - Administrator, Civil Service (Medical): No    Lack of Transportation (Non-Medical): No  Physical Activity: Sufficiently Active (06/24/2023)   Exercise Vital Sign    Days of Exercise per Week: 6 days    Minutes of Exercise per Session: 60 min  Stress: No Stress Concern Present (06/24/2023)   [employer] of Occupational Health - Occupational Stress Questionnaire    Feeling of Stress :  Not at all  Social Connections: Moderately Isolated (06/24/2023)   Social Connection and Isolation Panel    Frequency of Communication with Friends and Family: More than three times a week    Frequency of Social Gatherings with Friends and Family: More than three times a week    Attends Religious Services: More than 4 times per year    Active Member of [employer] or Organizations: No    Attends Banker Meetings: Never    Marital Status: Never married     Family History: The patient's family history includes Hyperlipidemia in his brother; Hypertension in his father and mother; Stroke in his brother.  ROS:   Please see the history of present illness.     All other systems reviewed and are negative.  EKGs/Labs/Other Studies Reviewed:    The following studies were reviewed today:  EKG Interpretation Date/Time:  Thursday November 06 2023 08:52:41 EDT Ventricular Rate:  65 PR Interval:  172 QRS Duration:  72 QT Interval:  368 QTC Calculation: 382 R Axis:   63  Text Interpretation: Normal sinus rhythm with sinus arrhythmia Normal ECG    Confirmed by Constancia Delton ([id number]) on 11/06/2023 9:04:49 AM    Recent Labs: 09/23/2023: ALT 27; BUN 22; Creatinine, Ser 1.15; Hemoglobin 16.4; Platelets 267; Potassium 4.8; Sodium 131  Recent Lipid Panel    Component Value Date/Time   CHOL 152 11/05/2022 0944   TRIG 46 11/05/2022 0944   HDL 70 11/05/2022 0944   CHOLHDL 2.2 11/05/2022 0944   LDLCALC 72 11/05/2022 0944     Risk Assessment/Calculations:          Physical Exam:    VS:  BP (!) 148/62 (BP Location: Left Arm, Cuff Size: Normal)   Pulse 65   Ht 6' (1.829 m)   Wt 165 lb 9.6 oz (75.1 kg)   SpO2 95%   BMI 22.46 kg/m     Wt Readings from Last 3 Encounters:  11/06/23 165 lb 9.6 oz (75.1 kg)  09/23/23 160 lb (72.6 kg)  09/15/23 180 lb (81.6 kg)     GEN:  Well nourished, well developed in no acute distress HEENT: Normal NECK: No JVD; No carotid bruits CARDIAC:  RRR, no murmurs, rubs, gallops RESPIRATORY: Expiratory wheezing. ABDOMEN: Soft, non-tender, non-distended MUSCULOSKELETAL:  No edema; No deformity  SKIN: Warm and dry NEUROLOGIC:  Alert and oriented x 3 PSYCHIATRIC:  Normal affect   ASSESSMENT:    1. Precordial pain   2. Primary hypertension   3. Pure hypercholesterolemia   4. Current smoker    PLAN:    In order of problems listed  above:  Chest pain, appears GI related, resolved after PPI and antibiotics.  H. pylori breath test was positive.  Obtain echo to rule out any structural abnormalities due to several cardiac risk factors.  If echo is normal and patient stays asymptomatic, no indication for ischemic workup at this time. Hypertension, BP elevated, usually controlled.  Continue Norvasc  10 mg daily, lisinopril  40 mg daily, Toprol -XL 25 mg daily. Hyperlipidemia, cholesterol controlled, continue Lipitor 10 mg daily. Current smoker, expiratory wheezing on exam.  Patient may have emphysema, follow-up follow-up with PCP.  Smoking cessation advised.  F/u after echo     Medication Adjustments/Labs and Tests Ordered: Current medicines are reviewed at length with the patient today.  Concerns regarding medicines are outlined above.  Orders Placed This Encounter  Procedures   EKG 12-Lead   ECHOCARDIOGRAM COMPLETE   Meds ordered this encounter  Medications   aspirin  EC 81 MG tablet    Sig: Take 1 tablet (81 mg total) by mouth daily. Swallow whole.    Patient Instructions  Medication Instructions:  -start aspirin  81 mg once a day. You can pick this up from any drug store for embolic event prevention  *If you need a refill on your cardiac medications before your next appointment, please call your pharmacy*  Lab Work: No labs ordered today  If you have labs (blood work) drawn today and your tests are completely normal, you will receive your results only by: MyChart Message (if you have MyChart) OR A paper copy in the mail If  you have any lab test that is abnormal or we need to change your treatment, we will call you to review the results.  Testing/Procedures: Your physician has requested that you have an echocardiogram. Echocardiography is a painless test that uses sound waves to create images of your heart. It provides your doctor with information about the size and shape of your heart and how well your heart's chambers and valves are working.   You may receive an ultrasound enhancing agent through an IV if needed to better visualize your heart during the echo. This procedure takes approximately one hour.  There are no restrictions for this procedure.  This will take place at [address] [facility] [address], [place] [address]  Please note: We ask at that you not bring children with you during ultrasound (echo/ vascular) testing. Due to room size and safety concerns, children are not allowed in the ultrasound rooms during exams. Our front office staff cannot provide observation of children in our lobby area while testing is being conducted. An adult accompanying a patient to their appointment will only be allowed in the ultrasound room at the discretion of the ultrasound technician under special circumstances. We apologize for any inconvenience.   Follow-Up: At [facility], you and your health needs are our priority.  As part of our continuing mission to provide you with exceptional heart care, our providers are all part of one team.  This team includes your primary Cardiologist (physician) and Advanced Practice Providers or APPs (Physician Assistants and Nurse Practitioners) who all work together to provide you with the care you need, when you need it.  Your next appointment:   2 month(s)  Provider:   You may see Dr. Junnie Olives  We recommend signing up for the patient portal called MyChart.  Sign up information is provided on this After Visit Summary.  MyChart is used to connect with  patients for Virtual Visits (Telemedicine).  Patients are able  to view lab/test results, encounter notes, upcoming appointments, etc.  Non-urgent messages can be sent to your provider as well.   To learn more about what you can do with MyChart, go to [email or web address].      Signed, Constancia Delton, MD  11/06/2023 10:12 AM     [facility]

## 2023-11-06 NOTE — Patient Instructions (Signed)
 Medication Instructions:  -start aspirin  81 mg once a day. You can pick this up from any drug store for embolic event prevention  *If you need a refill on your cardiac medications before your next appointment, please call your pharmacy*  Lab Work: No labs ordered today  If you have labs (blood work) drawn today and your tests are completely normal, you will receive your results only by: MyChart Message (if you have MyChart) OR A paper copy in the mail If you have any lab test that is abnormal or we need to change your treatment, we will call you to review the results.  Testing/Procedures: Your physician has requested that you have an echocardiogram. Echocardiography is a painless test that uses sound waves to create images of your heart. It provides your doctor with information about the size and shape of your heart and how well your heart's chambers and valves are working.   You may receive an ultrasound enhancing agent through an IV if needed to better visualize your heart during the echo. This procedure takes approximately one hour.  There are no restrictions for this procedure.  This will take place at [address] [facility] [address], [place] [address]  Please note: We ask at that you not bring children with you during ultrasound (echo/ vascular) testing. Due to room size and safety concerns, children are not allowed in the ultrasound rooms during exams. Our front office staff cannot provide observation of children in our lobby area while testing is being conducted. An adult accompanying a patient to their appointment will only be allowed in the ultrasound room at the discretion of the ultrasound technician under special circumstances. We apologize for any inconvenience.   Follow-Up: At [facility], you and your health needs are our priority.  As part of our continuing mission to provide you with exceptional heart care, our providers are all part of one team.   This team includes your primary Cardiologist (physician) and Advanced Practice Providers or APPs (Physician Assistants and Nurse Practitioners) who all work together to provide you with the care you need, when you need it.  Your next appointment:   2 month(s)  Provider:   You may see Dr. Junnie Olives  We recommend signing up for the patient portal called MyChart.  Sign up information is provided on this After Visit Summary.  MyChart is used to connect with patients for Virtual Visits (Telemedicine).  Patients are able to view lab/test results, encounter notes, upcoming appointments, etc.  Non-urgent messages can be sent to your provider as well.   To learn more about what you can do with MyChart, go to [email or web address].

## 2023-12-02 ENCOUNTER — Ambulatory Visit: Attending: Cardiology

## 2023-12-02 DIAGNOSIS — R072 Precordial pain: Secondary | ICD-10-CM

## 2023-12-02 LAB — ECHOCARDIOGRAM COMPLETE
AR max vel: 3.3 cm2
AV Area VTI: 3.07 cm2
AV Area mean vel: 3.53 cm2
AV Mean grad: 3.5 mmHg
AV Peak grad: 8.7 mmHg
Ao pk vel: 1.48 m/s
Area-P 1/2: 5.13 cm2
Calc EF: 63.5 %
S' Lateral: 3 cm
Single Plane A2C EF: 67.4 %
Single Plane A4C EF: 67.5 %

## 2023-12-03 ENCOUNTER — Ambulatory Visit: Payer: Self-pay | Admitting: Cardiology

## 2023-12-09 ENCOUNTER — Encounter: Admitting: Physician Assistant

## 2023-12-19 ENCOUNTER — Other Ambulatory Visit: Payer: Self-pay | Admitting: Physician Assistant

## 2023-12-19 DIAGNOSIS — K219 Gastro-esophageal reflux disease without esophagitis: Secondary | ICD-10-CM

## 2024-01-03 NOTE — Progress Notes (Signed)
 Complete physical exam  Patient: Richard Lamb   DOB: [birth year]   73 y.o. Male  MRN: [id number] Visit Date: 01/06/2024  Today's healthcare provider: Jolynn Spencer, PA-C   Chief Complaint  Patient presents with   Annual Exam    Last completed 11/05/22, AWV 06/24/23 Diet -   Exercise -  Feeling -  Sleeping -  Concerns -     Subjective    Richard Lamb is a 73 y.o. male who presents today for a complete physical exam.   HPI     Annual Exam    Additional comments: Last completed 11/05/22, AWV 06/24/23 Diet -   Exercise -  Feeling -  Sleeping -  Concerns -        Last edited by Lilian Fitzpatrick, CMA on 01/06/2024  1:29 PM.      Discussed the use of AI scribe software for clinical note transcription with the patient, who gave verbal consent to proceed.  History of Present Illness Richard Lamb is a 73 year old male with hypertension who presents for medication management and follow-up.  He is taking amlodipine , lisinopril , and metoprolol  for hypertension. Anxiety during medical visits may affect his blood pressure readings. He also takes atorvastatin  for hyperlipidemia.  He has a history of H. pylori infection, previously treated with medication, which resolved his hiccups. He currently takes omeprazole , which he believes helps with his hiccups.  He smokes two cigarettes a day and has concerns about lung health due to smoking. He sleeps about five hours a night and feels it is sufficient. He maintains a healthy diet and walks frequently[place].  He has had all COVID vaccinations but does not wish to receive more due to a bad reaction to the pneumonia shot. He has not received a shingles vaccine and is hesitant due to a neighbor's adverse reaction.  He has not seen an eye doctor in over a year.   Last depression screening scores    09/23/2023   11:09 AM 09/05/2023   10:05 AM 06/24/2023    2:33 PM  PHQ 2/9 Scores  PHQ - 2  Score 0 0 1  PHQ- 9 Score 0 0 1   Last fall risk screening    06/24/2023    2:36 PM  Fall Risk   Falls in the past year? 0  Number falls in past yr: 0  Injury with Fall? 0  Risk for fall due to : No Fall Risks  Follow up Falls prevention discussed;Falls evaluation completed   Last Audit-C alcohol use screening    06/24/2023    2:32 PM  Alcohol Use Disorder Test (AUDIT)  1. How often do you have a drink containing alcohol? 4  2. How many drinks containing alcohol do you have on a typical day when you are drinking? 0  3. How often do you have six or more drinks on one occasion? 0  AUDIT-C Score 4  4. How often during the last year have you found that you were not able to stop drinking once you had started? 0  5. How often during the last year have you failed to do what was normally expected from you because of drinking? 0  6. How often during the last year have you needed a first drink in the morning to get yourself going after a heavy drinking session? 0  7. How often during the last year have you had a feeling of guilt  of remorse after drinking? 0  8. How often during the last year have you been unable to remember what happened the night before because you had been drinking? 0  9. Have you or someone else been injured as a result of your drinking? 0  10. Has a relative or friend or a doctor or another health worker been concerned about your drinking or suggested you cut down? 0  Alcohol Use Disorder Identification Test Final Score (AUDIT) 4   A score of 3 or more in women, and 4 or more in men indicates increased risk for alcohol abuse, EXCEPT if all of the points are from question 1   Past Medical History:  Diagnosis Date   Anxiety    GERD (gastroesophageal reflux disease)    Hyperlipidemia    Hypertension    Past Surgical History:  Procedure Laterality Date   CYSTOSCOPY WITH LITHOLAPAXY N/A 06/30/2018   Procedure: CYSTOSCOPY WITH LITHOLAPAXY;  Surgeon: Twylla Glendia BROCKS, MD;   Location: [facility] ORS;  Service: Urology;  Laterality: N/A;   PROSTATE SURGERY     Dr.Wolfe   Social History   Socioeconomic History   Marital status: Single    Spouse name: Not on file   Number of children: 3   Years of education: Not on file   Highest education level: High school graduate  Occupational History    Comment: full time  Tobacco Use   Smoking status: Every Day    Current packs/day: 0.50    Types: Cigarettes   Smokeless tobacco: Never   Tobacco comments:    < than half pack a day.   Vaping Use   Vaping status: Never Used  Substance and Sexual Activity   Alcohol use: Yes    Alcohol/week: 7.0 standard drinks of alcohol    Types: 7 Cans of beer per week    Comment: 1 beer a day   Drug use: Yes    Types: Marijuana   Sexual activity: Yes    Birth control/protection: None  Other Topics Concern   Not on file  Social History Narrative   Not on file   Social Drivers of Health   Financial Resource Strain: Low Risk  (06/24/2023)   Overall Financial Resource Strain (CARDIA)    Difficulty of Paying Living Expenses: Not very hard  Food Insecurity: No Food Insecurity (06/24/2023)   Hunger Vital Sign    Worried About Running Out of Food in the Last Year: Never true    Ran Out of Food in the Last Year: Never true  Transportation Needs: No Transportation Needs (06/24/2023)   PRAPARE - Administrator, Civil Service (Medical): No    Lack of Transportation (Non-Medical): No  Physical Activity: Sufficiently Active (06/24/2023)   Exercise Vital Sign    Days of Exercise per Week: 6 days    Minutes of Exercise per Session: 60 min  Stress: No Stress Concern Present (06/24/2023)   [employer] of Occupational Health - Occupational Stress Questionnaire    Feeling of Stress : Not at all  Social Connections: Moderately Isolated (06/24/2023)   Social Connection and Isolation Panel    Frequency of Communication with Friends and Family: More than three times a week     Frequency of Social Gatherings with Friends and Family: More than three times a week    Attends Religious Services: More than 4 times per year    Active Member of [employer] or Organizations: No    Attends Banker  Meetings: Never    Marital Status: Never married  Intimate Partner Violence: Not At Risk (06/24/2023)   Humiliation, Afraid, Rape, and Kick questionnaire    Fear of Current or Ex-Partner: No    Emotionally Abused: No    Physically Abused: No    Sexually Abused: No   Family Status  Relation Name Status   Mother  Alive   Father  Deceased   Sister  Alive   Brother  Deceased  No partnership data on file   Family History  Problem Relation Age of Onset   Hypertension Mother    Hypertension Father    Stroke Brother    Hyperlipidemia Brother    Allergies  Allergen Reactions   Sulfa Antibiotics Anaphylaxis    Patient Care Team: [place], Aynslee Mulhall, PA-C as PCP - General (Physician Assistant)   Medications: Outpatient Medications Prior to Visit  Medication Sig   albuterol  (VENTOLIN  HFA) 108 (90 Base) MCG/ACT inhaler Inhale 2 puffs into the lungs every 6 (six) hours as needed for wheezing or shortness of breath.   amLODipine  (NORVASC ) 10 MG tablet TAKE 1 TABLET BY MOUTH EVERY DAY   aspirin  EC 81 MG tablet Take 1 tablet (81 mg total) by mouth daily. Swallow whole.   atorvastatin  (LIPITOR) 10 MG tablet TAKE 1 TABLET BY MOUTH EVERY DAY   celecoxib  (CELEBREX ) 200 MG capsule Take 1 capsule (200 mg total) by mouth 2 (two) times daily.   lisinopril  (ZESTRIL ) 40 MG tablet TAKE 1 TABLET BY MOUTH EVERY DAY   metoprolol  succinate (TOPROL -XL) 25 MG 24 hr tablet TAKE 1 TABLET (25 MG TOTAL) BY MOUTH DAILY.   sildenafil  (VIAGRA ) 100 MG tablet Take 0.5-1 tablets (50-100 mg total) by mouth daily as needed for erectile dysfunction.   No facility-administered medications prior to visit.    Review of Systems  All other systems reviewed and are negative.  Except see HPI      Objective    There were no vitals taken for this visit.     Physical Exam Vitals reviewed.  Constitutional:      General: He is not in acute distress.    Appearance: Normal appearance. He is well-developed. He is not ill-appearing, toxic-appearing or diaphoretic.  HENT:     Head: Normocephalic and atraumatic.     Right Ear: Tympanic membrane, ear canal and external ear normal.     Left Ear: Tympanic membrane, ear canal and external ear normal.     Nose: Nose normal. No congestion or rhinorrhea.     Mouth/Throat:     Mouth: Mucous membranes are moist.     Pharynx: Oropharynx is clear. No oropharyngeal exudate.  Eyes:     General: No scleral icterus.       Right eye: No discharge.        Left eye: No discharge.     Conjunctiva/sclera: Conjunctivae normal.     Pupils: Pupils are equal, round, and reactive to light.  Neck:     Thyroid: No thyromegaly.     Vascular: No carotid bruit.  Cardiovascular:     Rate and Rhythm: Normal rate and regular rhythm.     Pulses: Normal pulses.     Heart sounds: Normal heart sounds. No murmur heard.    No friction rub. No gallop.  Pulmonary:     Effort: Pulmonary effort is normal. No respiratory distress.     Breath sounds: Normal breath sounds. No wheezing or rales.  Abdominal:     General: Abdomen is  flat. Bowel sounds are normal. There is no distension.     Palpations: Abdomen is soft. There is no mass.     Tenderness: There is no abdominal tenderness. There is no right CVA tenderness, left CVA tenderness, guarding or rebound.     Hernia: No hernia is present.  Musculoskeletal:        General: No swelling, tenderness, deformity or signs of injury. Normal range of motion.     Cervical back: Normal range of motion and neck supple. No rigidity or tenderness.     Right lower leg: No edema.     Left lower leg: No edema.  Lymphadenopathy:     Cervical: No cervical adenopathy.  Skin:    General: Skin is warm and dry.     Coloration: Skin is  not jaundiced or pale.     Findings: No bruising, erythema, lesion or rash.  Neurological:     Mental Status: He is alert and oriented to person, place, and time. Mental status is at baseline.     Gait: Gait normal.  Psychiatric:        Mood and Affect: Mood normal.        Behavior: Behavior normal.        Thought Content: Thought content normal.        Judgment: Judgment normal.      No results found for any visits on 01/06/24.  Assessment & Plan    Routine Health Maintenance and Physical Exam  Exercise Activities and Dietary recommendations  Goals      DIET - EAT MORE FRUITS AND VEGETABLES     DIET - INCREASE WATER INTAKE     Quit Smoking     Recommend to continue efforts to reduce smoking habits until no longer smoking.         Immunization History  Administered Date(s) Administered   Fluad Quad(high Dose 65+) 03/30/2019, 03/16/2020, 03/06/2021   Influenza,inj,Quad PF,6+ Mos 04/07/2018   Influenza-Unspecified 03/17/2015, 03/04/2022, 03/27/2023   PFIZER(Purple Top)SARS-COV-2 Vaccination 07/03/2019, 07/27/2019, 04/25/2020   Pneumococcal Conjugate-13 12/28/2018   Pneumococcal Polysaccharide-23 01/25/2020    Health Maintenance  Topic Date Due   Colon Cancer Screening  Never done   Screening for Lung Cancer  Never done   Flu Shot  12/26/2023   COVID-19 Vaccine (4 - 2024-25 season) 01/22/2024*   Zoster (Shingles) Vaccine (1 of 2) 04/07/2024*   DTaP/Tdap/Td vaccine (1 - Tdap) 01/05/2025*   Medicare Annual Wellness Visit  06/23/2024   Pneumococcal Vaccine for age over 56  Completed   Hepatitis C Screening  Completed   Hepatitis B Vaccine  Aged Out   HPV Vaccine  Aged Out   Meningitis B Vaccine  Aged Out  *Topic was postponed. The date shown is not the original due date.    Discussed health benefits of physical activity, and encouraged him to engage in regular exercise appropriate for his age and condition. Assessment & Plan Hypertension Chronic  hypertension  is not well-controlled with current medications. Anxiety may affect office readings. - Continue amlodipine , lisinopril , and metoprolol . Measure blood pressure at home and bring blood pressure records to the next follow-up Strongly encouraged to adhere to low-salt diet and regular exercise Will follow-up  Hyperlipidemia Chronic  hyperlipidemia status with atorvastatin  is unclear. - Continue atorvastatin . Will update LP Will follow-up  Gastroesophageal reflux disease and history of Helicobacter pylori infection Chronic  omeprazole  is being taken, but GERD management status is unclear. H. pylori testing requires omeprazole  cessation. - Discuss  stopping omeprazole  for two weeks for H. pylori testing when feasible. Will follow-up  Tobacco use Chronic  advised cessation due to lung risks and abnormal lung sounds. - Advise smoking cessation. Will follow-up  Abnormal lung sounds Abnormal lung sounds possibly related to smoking. Pulmonology referral recommended. - Refer to pulmonology for lung evaluation. Advised to use inhalers and continue albuterol  Consider Wixela as a maintenance inhaler if patient agrees - Order lung cancer screening. Will follow-up  Allergic rhinitis  - Avoidance measures discussed. - Use nasal saline rinses before nose sprays such as with Neilmed Sinus Rinse bottle.  Use distilled water.   - Use Flonase 2 sprays each nostril daily. Aim upward and outward. - Use Zyrtec 10 mg daily.   Annual physical exam Well adult visit with abnormal findings Things to do to keep yourself healthy  - Exercise at least 30-45 minutes a day, 3-4 days a week.  - Eat a low-fat diet with lots of fruits and vegetables, up to 7-9 servings per day.  - Seatbelts can save your life. Wear them always.  - Smoke detectors on every level of your home, check batteries every year.  - Eye Doctor - have an eye exam every 1-2 years  - Safe sex - if you may be exposed to STDs, use a condom.   - Alcohol -  If you drink, do it moderately, less than 2 drinks per day.  - Health Care Power of Attorney. Choose someone to speak for you if you are not able.  - Depression is common in our stressful world.If you're feeling down or losing interest in things you normally enjoy, please come in for a visit.  - Violence - If anyone is threatening or hurting you, please call immediately.  Return in about 3 months (around 04/07/2024) for chronic disease f/u.    The patient was advised to call back or seek an in-person evaluation if the symptoms worsen or if the condition fails to improve as anticipated.  I discussed the assessment and treatment plan with the patient. The patient was provided an opportunity to ask questions and all were answered. The patient agreed with the plan and demonstrated an understanding of the instructions.  I, Raeford Brandenburg, PA-C have reviewed all documentation for this visit. The documentation on 01/06/2024  for the exam, diagnosis, procedures, and orders are all accurate and complete.  Jolynn Spencer, [facility], MMS [facility] [phone] (phone) [phone] (fax)  [facility]

## 2024-01-06 ENCOUNTER — Ambulatory Visit (INDEPENDENT_AMBULATORY_CARE_PROVIDER_SITE_OTHER): Admitting: Physician Assistant

## 2024-01-06 ENCOUNTER — Encounter: Payer: Self-pay | Admitting: Physician Assistant

## 2024-01-06 VITALS — BP 157/82 | Ht 72.0 in

## 2024-01-06 DIAGNOSIS — E7849 Other hyperlipidemia: Secondary | ICD-10-CM | POA: Diagnosis not present

## 2024-01-06 DIAGNOSIS — K219 Gastro-esophageal reflux disease without esophagitis: Secondary | ICD-10-CM | POA: Diagnosis not present

## 2024-01-06 DIAGNOSIS — J3089 Other allergic rhinitis: Secondary | ICD-10-CM | POA: Diagnosis not present

## 2024-01-06 DIAGNOSIS — Z720 Tobacco use: Secondary | ICD-10-CM | POA: Diagnosis not present

## 2024-01-06 DIAGNOSIS — R0989 Other specified symptoms and signs involving the circulatory and respiratory systems: Secondary | ICD-10-CM

## 2024-01-06 DIAGNOSIS — A048 Other specified bacterial intestinal infections: Secondary | ICD-10-CM

## 2024-01-06 DIAGNOSIS — R066 Hiccough: Secondary | ICD-10-CM

## 2024-01-06 DIAGNOSIS — Z0000 Encounter for general adult medical examination without abnormal findings: Secondary | ICD-10-CM | POA: Diagnosis not present

## 2024-01-06 DIAGNOSIS — I10 Essential (primary) hypertension: Secondary | ICD-10-CM | POA: Diagnosis not present

## 2024-01-13 ENCOUNTER — Ambulatory Visit: Attending: Cardiology | Admitting: Cardiology

## 2024-01-13 ENCOUNTER — Encounter: Payer: Self-pay | Admitting: Cardiology

## 2024-01-13 VITALS — BP 130/57 | HR 60 | Ht 72.0 in | Wt 171.0 lb

## 2024-01-13 DIAGNOSIS — R072 Precordial pain: Secondary | ICD-10-CM | POA: Diagnosis not present

## 2024-01-13 DIAGNOSIS — E7800 Pure hypercholesterolemia, unspecified: Secondary | ICD-10-CM

## 2024-01-13 DIAGNOSIS — I10 Essential (primary) hypertension: Secondary | ICD-10-CM | POA: Diagnosis not present

## 2024-01-13 DIAGNOSIS — F17200 Nicotine dependence, unspecified, uncomplicated: Secondary | ICD-10-CM

## 2024-01-13 NOTE — Patient Instructions (Signed)

## 2024-01-13 NOTE — Progress Notes (Signed)
 Cardiology Office Note:    Date:  01/13/2024   ID:  Richard Lamb, DOB [birth year], MRN [id number]  PCP:  Dineen Channel, PA-C   [facility] Providers Cardiologist:  None     Referring MD: Dineen Channel, PA-C   Chief Complaint  Patient presents with   Follow-up     month follow up after ECHO pt has been doing well with no complaints of chest pain, chest pressure or SOB, medciation reviewed verbally with patient    History of Present Illness:    Richard Lamb is a 73 y.o. male with a hx of hypertension, hyperlipidemia, current smoker x 20+ years presenting for follow-up.  Previously seen with symptoms of chest pain associated with nausea and feeling bloated.  Treated with PPIs with good effect and resolution of symptoms.  Echo was obtained to evaluate any significant structural abnormalities.  Patient feels well, denies any further episodes of chest pain.  He still smokes, is working on quitting.  Presents for echocardiogram results.  No concerns at this time.  Denies chest pain or shortness of breath.   Past Medical History:  Diagnosis Date   Anxiety    GERD (gastroesophageal reflux disease)    Hyperlipidemia    Hypertension     Past Surgical History:  Procedure Laterality Date   CYSTOSCOPY WITH LITHOLAPAXY N/A 06/30/2018   Procedure: CYSTOSCOPY WITH LITHOLAPAXY;  Surgeon: Twylla Glendia BROCKS, MD;  Location: [facility] ORS;  Service: Urology;  Laterality: N/A;   PROSTATE SURGERY     Dr.Wolfe    Current Medications: Current Meds  Medication Sig   albuterol  (VENTOLIN  HFA) 108 (90 Base) MCG/ACT inhaler Inhale 2 puffs into the lungs every 6 (six) hours as needed for wheezing or shortness of breath.   amLODipine  (NORVASC ) 10 MG tablet TAKE 1 TABLET BY MOUTH EVERY DAY   aspirin  EC 81 MG tablet Take 1 tablet (81 mg total) by mouth daily. Swallow whole.   atorvastatin  (LIPITOR) 10 MG tablet TAKE 1 TABLET BY MOUTH EVERY DAY   celecoxib  (CELEBREX ) 200 MG capsule Take  1 capsule (200 mg total) by mouth 2 (two) times daily.   lisinopril  (ZESTRIL ) 40 MG tablet TAKE 1 TABLET BY MOUTH EVERY DAY   metoprolol  succinate (TOPROL -XL) 25 MG 24 hr tablet TAKE 1 TABLET (25 MG TOTAL) BY MOUTH DAILY.   sildenafil  (VIAGRA ) 100 MG tablet Take 0.5-1 tablets (50-100 mg total) by mouth daily as needed for erectile dysfunction.     Allergies:   Sulfa antibiotics   Social History   Socioeconomic History   Marital status: Single    Spouse name: Not on file   Number of children: 3   Years of education: Not on file   Highest education level: High school graduate  Occupational History    Comment: full time  Tobacco Use   Smoking status: Every Day    Current packs/day: 0.50    Types: Cigarettes   Smokeless tobacco: Never   Tobacco comments:    < than half pack a day.   Vaping Use   Vaping status: Never Used  Substance and Sexual Activity   Alcohol use: Yes    Alcohol/week: 7.0 standard drinks of alcohol    Types: 7 Cans of beer per week    Comment: 1 beer a day   Drug use: Yes    Types: Marijuana   Sexual activity: Yes    Birth control/protection: None  Other Topics Concern   Not on file  Social  History Narrative   Not on file   Social Drivers of Health   Financial Resource Strain: Low Risk  (06/24/2023)   Overall Financial Resource Strain (CARDIA)    Difficulty of Paying Living Expenses: Not very hard  Food Insecurity: No Food Insecurity (06/24/2023)   Hunger Vital Sign    Worried About Running Out of Food in the Last Year: Never true    Ran Out of Food in the Last Year: Never true  Transportation Needs: No Transportation Needs (06/24/2023)   PRAPARE - Administrator, Civil Service (Medical): No    Lack of Transportation (Non-Medical): No  Physical Activity: Sufficiently Active (06/24/2023)   Exercise Vital Sign    Days of Exercise per Week: 6 days    Minutes of Exercise per Session: 60 min  Stress: No Stress Concern Present (06/24/2023)    [employer] of Occupational Health - Occupational Stress Questionnaire    Feeling of Stress : Not at all  Social Connections: Moderately Isolated (06/24/2023)   Social Connection and Isolation Panel    Frequency of Communication with Friends and Family: More than three times a week    Frequency of Social Gatherings with Friends and Family: More than three times a week    Attends Religious Services: More than 4 times per year    Active Member of [employer] or Organizations: No    Attends Banker Meetings: Never    Marital Status: Never married     Family History: The patient's family history includes Hyperlipidemia in his brother; Hypertension in his father and mother; Stroke in his brother.  ROS:   Please see the history of present illness.     All other systems reviewed and are negative.  EKGs/Labs/Other Studies Reviewed:    The following studies were reviewed today:       Recent Labs: 09/23/2023: ALT 27; BUN 22; Creatinine, Ser 1.15; Hemoglobin 16.4; Platelets 267; Potassium 4.8; Sodium 131  Recent Lipid Panel    Component Value Date/Time   CHOL 152 11/05/2022 0944   TRIG 46 11/05/2022 0944   HDL 70 11/05/2022 0944   CHOLHDL 2.2 11/05/2022 0944   LDLCALC 72 11/05/2022 0944     Risk Assessment/Calculations:          Physical Exam:    VS:  BP (!) 130/57 (BP Location: Left Arm, Patient Position: Sitting, Cuff Size: Normal) Comment: took meds 30 mi  ago  Pulse 60   Ht 6' (1.829 m)   Wt 171 lb (77.6 kg)   SpO2 95%   BMI 23.19 kg/m     Wt Readings from Last 3 Encounters:  01/13/24 171 lb (77.6 kg)  11/06/23 165 lb 9.6 oz (75.1 kg)  09/23/23 160 lb (72.6 kg)     GEN:  Well nourished, well developed in no acute distress HEENT: Normal NECK: No JVD; No carotid bruits CARDIAC: RRR, no murmurs, rubs, gallops RESPIRATORY: Expiratory wheezing. ABDOMEN: Soft, non-tender, non-distended MUSCULOSKELETAL:  No edema; No deformity  SKIN: Warm and  dry NEUROLOGIC:  Alert and oriented x 3 PSYCHIATRIC:  Normal affect   ASSESSMENT:    1. Precordial pain   2. Primary hypertension   3. Pure hypercholesterolemia   4. Current smoker    PLAN:    In order of problems listed above:  Chest pain, appears GI related, resolved after PPI and antibiotics.  H. pylori breath test was positive.  Echo 7/25 normal systolic and diastolic function, EF 60 to 65%.  No  indication for ischemic workup at this time. Hypertension, BP controlled.  Continue Norvasc  10 mg daily, lisinopril  40 mg daily, Toprol -XL 25 mg daily. Hyperlipidemia, cholesterol controlled, continue Lipitor 10 mg daily. Current smoker,Smoking cessation advised.  F/u as needed.     Medication Adjustments/Labs and Tests Ordered: Current medicines are reviewed at length with the patient today.  Concerns regarding medicines are outlined above.  No orders of the defined types were placed in this encounter.  No orders of the defined types were placed in this encounter.   Patient Instructions  Medication Instructions:  Your physician recommends that you continue on your current medications as directed. Please refer to the Current Medication list given to you today.   *If you need a refill on your cardiac medications before your next appointment, please call your pharmacy*  Lab Work: No labs ordered today  If you have labs (blood work) drawn today and your tests are completely normal, you will receive your results only by: MyChart Message (if you have MyChart) OR A paper copy in the mail If you have any lab test that is abnormal or we need to change your treatment, we will call you to review the results.  Testing/Procedures: No test ordered today   Follow-Up: At [facility], you and your health needs are our priority.  As part of our continuing mission to provide you with exceptional heart care, our providers are all part of one team.  This team includes your primary  Cardiologist (physician) and Advanced Practice Providers or APPs (Physician Assistants and Nurse Practitioners) who all work together to provide you with the care you need, when you need it.  Your next appointment:   As needed         Signed, Redell Cave, MD  01/13/2024 9:42 AM    [facility]

## 2024-01-30 ENCOUNTER — Other Ambulatory Visit: Payer: Self-pay | Admitting: Physician Assistant

## 2024-01-30 DIAGNOSIS — I10 Essential (primary) hypertension: Secondary | ICD-10-CM

## 2024-05-05 ENCOUNTER — Other Ambulatory Visit: Payer: Self-pay

## 2024-05-05 ENCOUNTER — Telehealth: Payer: Self-pay | Admitting: Physician Assistant

## 2024-05-05 DIAGNOSIS — I10 Essential (primary) hypertension: Secondary | ICD-10-CM

## 2024-05-05 DIAGNOSIS — E785 Hyperlipidemia, unspecified: Secondary | ICD-10-CM

## 2024-05-05 MED ORDER — LISINOPRIL 40 MG PO TABS: 40.0000 mg | ORAL_TABLET | Freq: Every day | ORAL | 0 refills | Status: AC

## 2024-05-05 MED ORDER — AMLODIPINE BESYLATE 10 MG PO TABS: 10.0000 mg | ORAL_TABLET | Freq: Every day | ORAL | 0 refills | Status: AC

## 2024-05-05 MED ORDER — ATORVASTATIN CALCIUM 10 MG PO TABS: 10.0000 mg | ORAL_TABLET | Freq: Every day | ORAL | 0 refills | Status: AC

## 2024-05-05 NOTE — Telephone Encounter (Addendum)
 Refill Request   Pharmacy: [facility]  Medication:  amLODipine  (NORVASC ) 10 MG tablet [9069]   lisinopril  (ZESTRIL ) 40 MG tablet [10450]  atorvastatin  (LIPITOR) 10 MG tablet [19176]   Quantity (if provided): 90 Please send in refill request.

## 2024-05-05 NOTE — Telephone Encounter (Signed)
 Medication has been sent as 90 day supply no refills. Called pt to advised pt needs to stop by office to have his blood work drawn, pt verbalized understanding.

## 2024-05-06 DIAGNOSIS — K219 Gastro-esophageal reflux disease without esophagitis: Secondary | ICD-10-CM | POA: Diagnosis not present

## 2024-05-07 LAB — COMPREHENSIVE METABOLIC PANEL WITH GFR
ALT: 16 IU/L (ref 0–44)
AST: 19 IU/L (ref 0–40)
Albumin: 4.3 g/dL (ref 3.8–4.8)
Alkaline Phosphatase: 47 IU/L (ref 47–123)
BUN/Creatinine Ratio: 16 (ref 10–24)
BUN: 16 mg/dL (ref 8–27)
Bilirubin Total: 0.7 mg/dL (ref 0.0–1.2)
CO2: 26 mmol/L (ref 20–29)
Calcium: 9.7 mg/dL (ref 8.6–10.2)
Chloride: 99 mmol/L (ref 96–106)
Creatinine, Ser: 0.99 mg/dL (ref 0.76–1.27)
Globulin, Total: 2.6 g/dL (ref 1.5–4.5)
Glucose: 93 mg/dL (ref 70–99)
Potassium: 4.9 mmol/L (ref 3.5–5.2)
Sodium: 140 mmol/L (ref 134–144)
Total Protein: 6.9 g/dL (ref 6.0–8.5)
eGFR: 81 mL/min/1.73 (ref >59)

## 2024-05-07 LAB — LIPID PANEL WITH LDL/HDL RATIO
Cholesterol, Total: 160 mg/dL (ref 100–199)
HDL: 79 mg/dL (ref >39)
LDL Chol Calc (NIH): 71 mg/dL (ref 0–99)
LDL/HDL Ratio: 0.9 ratio (ref 0.0–3.6)
Triglycerides: 45 mg/dL (ref 0–149)
VLDL Cholesterol Cal: 10 mg/dL (ref 5–40)

## 2024-05-09 ENCOUNTER — Ambulatory Visit: Payer: Self-pay | Admitting: Physician Assistant

## 2024-05-09 NOTE — Progress Notes (Signed)
 All labs wnl/stable for him

## 2024-06-29 ENCOUNTER — Ambulatory Visit: Payer: Self-pay

## 2024-06-29 VITALS — BP 140/82 | Ht 72.0 in | Wt 170.6 lb

## 2024-06-29 DIAGNOSIS — Z0000 Encounter for general adult medical examination without abnormal findings: Secondary | ICD-10-CM

## 2024-06-29 NOTE — Patient Instructions (Signed)
 Mr. Couse,  Thank you for taking the time for your Medicare Wellness Visit. I appreciate your continued commitment to your health goals. Please review the care plan we discussed, and feel free to reach out if I can assist you further.  Please note that Annual Wellness Visits do not include a physical exam. Some assessments may be limited, especially if the visit was conducted virtually. If needed, we may recommend an in-person follow-up with your provider.  Ongoing Care Seeing your primary care provider every 3 to 6 months helps us  monitor your health and provide consistent, personalized care.   Referrals If a referral was made during today's visit and you haven't received any updates within two weeks, please contact the referred provider directly to check on the status.  Recommended Screenings:  Health Maintenance  Topic Date Due   Colon Cancer Screening  Never done   Screening for Lung Cancer  Never done   Zoster (Shingles) Vaccine (1 of 2) Never done   COVID-19 Vaccine (4 - 2025-26 season) 01/26/2024   Medicare Annual Wellness Visit  06/23/2024   DTaP/Tdap/Td vaccine (1 - Tdap) 01/05/2025*   Pneumococcal Vaccine for age over 17  Completed   Flu Shot  Completed   Hepatitis C Screening  Completed   Meningitis B Vaccine  Aged Out  *Topic was postponed. The date shown is not the original due date.     Vision: Annual vision screenings are recommended for early detection of glaucoma, cataracts, and diabetic retinopathy. These exams can also reveal signs of chronic conditions such as diabetes and high blood pressure.  Dental: Annual dental screenings help detect early signs of oral cancer, gum disease, and other conditions linked to overall health, including heart disease and diabetes.  Please see the attached documents for additional preventive care recommendations.   NEXT AWV 07/05/25 @ 1:10 PM IN PERSON

## 2024-06-30 ENCOUNTER — Telehealth: Payer: Self-pay

## 2024-06-30 NOTE — Telephone Encounter (Signed)
-----   Message from Janna Ostwalt sent at 06/29/2024  3:27 PM EST ----- Regarding: RE: MED Please, let pt know that for sildenafil  Rx , he would need to follow-up with us  as his last dispense was in 2024. ----- Message ----- From: Gwenn Jhonnie RAMAN, LPN Sent: 11/30/7971   1:39 PM EST To: Jolynn Spencer, PA-C Subject: MED                                            Hi Janna, this gentleman needs another refill on his sildenafil . Thanks!

## 2024-06-30 NOTE — Telephone Encounter (Signed)
 Called patient, not able to [facility] voicemail not set up yet. If patient calls back okay for [id number] to relay message and schedule patient wioth provider for chronic follow up and med refill

## 2024-08-09 ENCOUNTER — Ambulatory Visit: Admitting: Physician Assistant

## 2025-07-05 ENCOUNTER — Ambulatory Visit
